# Patient Record
Sex: Female | Born: 1955 | Race: White | Hispanic: No | Marital: Single | Smoking: Never smoker
Health system: Southern US, Community
[De-identification: ages and names within clinical notes are randomized; demographics above are authoritative.]

## PROBLEM LIST (undated history)

## (undated) DIAGNOSIS — I10 Essential (primary) hypertension: Secondary | ICD-10-CM

## (undated) HISTORY — PX: REPAIR OF ACUTE ASCENDING THORACIC AORTIC DISSECTION: SHX6323

---

## 2009-10-17 ENCOUNTER — Emergency Department: Payer: Self-pay | Admitting: Unknown Physician Specialty

## 2013-05-28 ENCOUNTER — Emergency Department (HOSPITAL_COMMUNITY): Payer: BC Managed Care – PPO

## 2013-05-28 ENCOUNTER — Emergency Department (HOSPITAL_COMMUNITY)
Admission: EM | Admit: 2013-05-28 | Discharge: 2013-05-28 | Payer: BC Managed Care – PPO | Attending: Emergency Medicine | Admitting: Emergency Medicine

## 2013-05-28 ENCOUNTER — Encounter (HOSPITAL_COMMUNITY): Payer: Self-pay | Admitting: Emergency Medicine

## 2013-05-28 DIAGNOSIS — Z79899 Other long term (current) drug therapy: Secondary | ICD-10-CM | POA: Insufficient documentation

## 2013-05-28 DIAGNOSIS — I10 Essential (primary) hypertension: Secondary | ICD-10-CM | POA: Insufficient documentation

## 2013-05-28 DIAGNOSIS — R209 Unspecified disturbances of skin sensation: Secondary | ICD-10-CM | POA: Diagnosis present

## 2013-05-28 DIAGNOSIS — I71019 Dissection of thoracic aorta, unspecified: Secondary | ICD-10-CM

## 2013-05-28 DIAGNOSIS — I7101 Dissection of thoracic aorta: Secondary | ICD-10-CM

## 2013-05-28 DIAGNOSIS — Z7982 Long term (current) use of aspirin: Secondary | ICD-10-CM | POA: Insufficient documentation

## 2013-05-28 HISTORY — DX: Essential (primary) hypertension: I10

## 2013-05-28 LAB — CBC WITH DIFFERENTIAL/PLATELET
Eosinophils Absolute: 0.2 10*3/uL (ref 0.0–0.7)
Eosinophils Relative: 2 % (ref 0–5)
HCT: 41.1 % (ref 36.0–46.0)
Hemoglobin: 14.3 g/dL (ref 12.0–15.0)
Lymphs Abs: 2.7 10*3/uL (ref 0.7–4.0)
MCH: 31.9 pg (ref 26.0–34.0)
MCV: 91.7 fL (ref 78.0–100.0)
Monocytes Relative: 6 % (ref 3–12)
Neutrophils Relative %: 63 % (ref 43–77)
RBC: 4.48 MIL/uL (ref 3.87–5.11)
WBC: 9.2 10*3/uL (ref 4.0–10.5)

## 2013-05-28 LAB — POCT I-STAT, CHEM 8
BUN: 18 mg/dL (ref 6–23)
Calcium, Ion: 1.13 mmol/L (ref 1.12–1.23)
Chloride: 106 mEq/L (ref 96–112)
Creatinine, Ser: 0.9 mg/dL (ref 0.50–1.10)
Glucose, Bld: 232 mg/dL — ABNORMAL HIGH (ref 70–99)
HCT: 45 % (ref 36.0–46.0)
Hemoglobin: 15.3 g/dL — ABNORMAL HIGH (ref 12.0–15.0)

## 2013-05-28 LAB — COMPREHENSIVE METABOLIC PANEL
Alkaline Phosphatase: 49 U/L (ref 39–117)
BUN: 18 mg/dL (ref 6–23)
Calcium: 9.8 mg/dL (ref 8.4–10.5)
GFR calc Af Amer: >90 mL/min (ref >90)
Glucose, Bld: 237 mg/dL — ABNORMAL HIGH (ref 70–99)
Potassium: 3.7 mEq/L (ref 3.5–5.1)
Total Bilirubin: 0.8 mg/dL (ref 0.3–1.2)
Total Protein: 7.3 g/dL (ref 6.0–8.3)

## 2013-05-28 LAB — TYPE AND SCREEN: ABO/RH(D): O POS

## 2013-05-28 LAB — GLUCOSE, CAPILLARY: Glucose-Capillary: 197 mg/dL — ABNORMAL HIGH (ref 70–99)

## 2013-05-28 LAB — LACTIC ACID, PLASMA: Lactic Acid, Venous: 2.8 mmol/L — ABNORMAL HIGH (ref 0.5–2.2)

## 2013-05-28 MED ORDER — LABETALOL HCL 5 MG/ML IV SOLN
20.0000 mg | Freq: Once | INTRAVENOUS | Status: AC
  Administered 2013-05-28: 20 mg via INTRAVENOUS
  Filled 2013-05-28: qty 4

## 2013-05-28 MED ORDER — MORPHINE SULFATE 2 MG/ML IJ SOLN
2.0000 mg | Freq: Once | INTRAMUSCULAR | Status: AC
  Administered 2013-05-28: 2 mg via INTRAVENOUS
  Filled 2013-05-28: qty 1

## 2013-05-28 MED ORDER — LABETALOL HCL 5 MG/ML IV SOLN
1.0000 mg/min | INTRAVENOUS | Status: DC
  Filled 2013-05-28: qty 100

## 2013-05-28 MED ORDER — LABETALOL HCL 5 MG/ML IV SOLN
INTRAVENOUS | Status: AC
  Filled 2013-05-28: qty 4

## 2013-05-28 MED ORDER — IOHEXOL 350 MG/ML SOLN
100.0000 mL | Freq: Once | INTRAVENOUS | Status: AC | PRN
  Administered 2013-05-28: 100 mL via INTRAVENOUS

## 2013-05-28 MED ORDER — LABETALOL HCL 5 MG/ML IV SOLN
20.0000 mg | Freq: Once | INTRAVENOUS | Status: AC
  Administered 2013-05-28: 20 mg via INTRAVENOUS

## 2013-05-28 MED ORDER — MORPHINE SULFATE 4 MG/ML IJ SOLN
4.0000 mg | Freq: Once | INTRAMUSCULAR | Status: AC
  Administered 2013-05-28: 4 mg via INTRAVENOUS
  Filled 2013-05-28: qty 1

## 2013-05-28 MED ORDER — SODIUM CHLORIDE 0.9 % IV SOLN
Freq: Once | INTRAVENOUS | Status: AC
  Administered 2013-05-28: 13:00:00 via INTRAVENOUS

## 2013-05-28 NOTE — ED Notes (Signed)
Pt has been accepted to [facility]. [facility] preparing for transfer.

## 2013-05-28 NOTE — ED Notes (Signed)
Pt's family now at bedside. Pt is A&O x4 but weak and clammy. Unable to palpate pedal pulse bilaterally. Weak femoral pulse bilaterally.

## 2013-05-28 NOTE — ED Notes (Signed)
Pt's O2 % dropped into mid-80s. Pt placed on NRB mask.Pulse ox increasing to upper 90s.

## 2013-05-28 NOTE — ED Notes (Signed)
Pt is extremely weak in lower extremities and states she is unable to move her legs. Faint pedal pulse palpated on left foot. Unable to palpate pulse on right foot. Weak femoral pulses palpated bilaterally.

## 2013-05-28 NOTE — ED Notes (Signed)
[facility] have arrived for transport. Waiting to hear back from Cardiology team about which facility pt will be transferred to.

## 2013-05-28 NOTE — ED Notes (Signed)
Report called to charge nurse at [facility] ED. [facility] in route.

## 2013-05-28 NOTE — ED Provider Notes (Addendum)
CSN: [id number]     Arrival date & time 05/28/13  1131 History  This chart was scribed for Julie Lennert, MD by Ardelia Mems, ED Scribe. This patient was seen in room APA01/APA01 and the patient's care was started at 11:49 AM.   Chief Complaint  Patient presents with  . Numbness    Patient is a 57 y.o. female presenting with chest pain. The history is provided by the patient. No language interpreter was used.  Chest Pain Pain location:  Unable to specify Pain severity:  Moderate Onset quality:  Gradual Progression:  Improving Relieved by:  None tried Worsened by:  Nothing tried Ineffective treatments:  None tried Associated symptoms: numbness   Associated symptoms: no abdominal pain, no back pain, no cough, no fatigue and no headache   Risk factors comment:  History of aortic dissection   HPI Comments: Julie Singleton is a 57 y.o. Female with a history of HTN and aortic dissection brought by EMS to the Emergency Department complaining of bilateral leg numbness onset about 1 hour ago. She denies any associated leg pain. She states that she had chest pain prior to the onset of her leg numbness, and she states that her chest pain has resolved some since onset. She states that she has a history of aortic dissection 3 years ago, which was surgically repaired at [facility]. She denies abdominal pain.   Past Medical History  Diagnosis Date  . Hypertension    Past Surgical History  Procedure Laterality Date  . Repair of acute ascending thoracic aortic dissection     No family history on file. History  Substance Use Topics  . Smoking status: Not on file  . Smokeless tobacco: Not on file  . Alcohol Use: Not on file   OB History   Grav Para Term Preterm Abortions TAB SAB Ect Mult Living                 Review of Systems  Constitutional: Negative for appetite change and fatigue.  HENT: Negative for congestion, ear discharge and sinus pressure.   Eyes: Negative for discharge.   Respiratory: Negative for cough.   Cardiovascular: Positive for chest pain.  Gastrointestinal: Negative for abdominal pain and diarrhea.  Genitourinary: Negative for frequency and hematuria.  Musculoskeletal: Negative for back pain.  Skin: Negative for rash.  Neurological: Positive for numbness. Negative for seizures and headaches.  Psychiatric/Behavioral: Negative for hallucinations.    Allergies  Review of patient's allergies indicates no known allergies.  Home Medications   Current Outpatient Rx  Name  Route  Sig  Dispense  Refill  . aspirin EC 81 MG tablet   Oral   Take 81 mg by mouth daily.         Marland Kitchen lisinopril (PRINIVIL,ZESTRIL) 5 MG tablet   Oral   Take 5 mg by mouth daily.         . metoprolol succinate (TOPROL-XL) 25 MG 24 hr tablet   Oral   Take 25 mg by mouth daily.         . sertraline (ZOLOFT) 50 MG tablet   Oral   Take 50 mg by mouth daily.         . simvastatin (ZOCOR) 40 MG tablet   Oral   Take 40 mg by mouth at bedtime.           Triage Vitals: BP 191/89  Pulse 62  Temp(Src) 97.3 F (36.3 C) (Oral)  Resp 28  SpO2 97%  Physical Exam  Nursing note and vitals reviewed. Constitutional: She is oriented to person, place, and time. She appears well-developed.  Mildly diaphoretic.   HENT:  Head: Normocephalic.  Eyes: Conjunctivae and EOM are normal. No scleral icterus.  Neck: Neck supple. No thyromegaly present.  Cardiovascular: Normal rate and regular rhythm.  Exam reveals no gallop and no friction rub.   No murmur heard. 1+ femoral pulses.  Pulmonary/Chest: No stridor. She has no wheezes. She has no rales. She exhibits no tenderness.  Healed scar on chest.   Abdominal: She exhibits no distension. There is no tenderness. There is no rebound.  Musculoskeletal: Normal range of motion. She exhibits no edema.  Mild modeled lower extremities bilaterally. Both feet feel cold.   Lymphadenopathy:    She has no cervical adenopathy.   Neurological: She is oriented to person, place, and time. She exhibits normal muscle tone. Coordination normal.  Skin: No rash noted. No erythema.  Psychiatric: She has a normal mood and affect. Her behavior is normal.    ED Course  Procedures (including critical care time)  DIAGNOSTIC STUDIES: Oxygen Saturation is 97% on RA, normal by my interpretation.    COORDINATION OF CARE: 11:58 AM- Discussed plan for diagnostic lab work and radiology. Will order IV fluids. Pt advised of plan for treatment and pt agrees.  12:43 PM-Recheck with pt. Discussed possible plan for admission to [facility] and pt agrees.  Medications  0.9 %  sodium chloride infusion ( Intravenous New Bag/Given 05/28/13 1230)  iohexol (OMNIPAQUE) 350 MG/ML injection 100 mL (100 mLs Intravenous Contrast Given 05/28/13 1206)  labetalol (NORMODYNE,TRANDATE) injection 20 mg (20 mg Intravenous Given 05/28/13 1248)  labetalol (NORMODYNE,TRANDATE) injection 20 mg (20 mg Intravenous Given 05/28/13 1310)  morphine 4 MG/ML injection 4 mg (4 mg Intravenous Given 05/28/13 1313)  morphine 2 MG/ML injection 2 mg (2 mg Intravenous Given 05/28/13 1351)  labetalol (NORMODYNE,TRANDATE) injection 20 mg (20 mg Intravenous Given 05/28/13 1351)   Labs Review Labs Reviewed  GLUCOSE, CAPILLARY - Abnormal; Notable for the following:    Glucose-Capillary 197 (*)    All other components within normal limits  COMPREHENSIVE METABOLIC PANEL - Abnormal; Notable for the following:    Glucose, Bld 237 (*)    All other components within normal limits  LACTIC ACID, PLASMA - Abnormal; Notable for the following:    Lactic Acid, Venous 2.8 (*)    All other components within normal limits  POCT I-STAT, CHEM 8 - Abnormal; Notable for the following:    Glucose, Bld 232 (*)    Hemoglobin 15.3 (*)    All other components within normal limits  CBC WITH DIFFERENTIAL  TROPONIN I  POCT I-STAT TROPONIN I  TYPE AND SCREEN   Imaging Review Ct Angio Chest Aorta  W/cm &/or Wo/cm  05/28/2013   CLINICAL DATA:  Chest pain, bilateral leg weakness and numbness, evaluate for aortic dissection  EXAM: CT ANGIOGRAPHY CHEST, ABDOMEN AND PELVIS  TECHNIQUE: Multidetector CT imaging through the chest, abdomen and pelvis was performed using the standard protocol during bolus administration of intravenous contrast. Multiplanar reconstructed images including MIPs were obtained and reviewed to evaluate the vascular anatomy. Additional delayed images of the pelvis were obtained.  CONTRAST:  OMNIPAQUE IOHEXOL 350 MG/ML SOLN  COMPARISON:  None.  FINDINGS: CTA CHEST FINDINGS  Mediastinum: High attenuation material in the upper mediastinum consistent with acute mediastinal hemorrhage. The hemorrhage appears to be originating from an acute dissection/contained rupture of the proximal descending thoracic aorta. Hemorrhage  extends cephalad surrounding the proximal left subclavian vein and inferiorly to the aortic hiatus.  Heart/Vascular: Surgical changes of what appears to be tube graft repair of the ascending thoracic aorta with re- implantation of the brachiocephalic artery and left common carotid artery which arise from a common trunk. The left subclavian artery appears to originate from the tube graft just proximal to the anastomosis with the native aorta. 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 The true lumen is nearly completely collapsed. There is likely a contained rupture of the proximal descending thoracic aorta given the amount of surrounding mediastinal hematoma. No definite active extravasation into the mediastinum identified. Mild dilatation of the aortic root which measures 4.1 cm at the sinuses of Valsalva. The aortic valve appears to be native. Mild left atrial and ventricular dilatation.   Lungs/Pleura: Mild dependent atelectasis bilaterally. Small layering left pleural effusion appears water attenuation. No definite hemothorax.  Bones/Soft Tissues: No acute fracture or aggressive appearing lytic or blastic osseous lesion.  Review of the MIP images confirms the above findings.  CTA ABDOMEN AND PELVIS FINDINGS  VASCULAR  Aorta: Continuation of type B aortic dissection throughout the aorta. The true lumen is significantly compressed and becomes completely obliterated in the infrarenal abdominal aorta. On the initial arterial phase images, no flow reaches the aortic bifurcation. The celiac artery, SMA artery, right renal artery and IMA all arise from the true lumen. The left renal artery appears to arise from the false lumen.  Celiac: Arises from the true lumen. Patent. Conventional hepatic arterial anatomy.  SMA: Arises from the true lumen.  Patent.  Renals: Single dominant renal arteries bilaterally. The right renal artery appears to arise from the true lumen while the left appears to arise predominantly from the false lumen. There may be a small fenestration with some flow from the true lumen as well.  IMA: Arises from the nearly obliterated false lumen.  Inflow: On the initial arterial phase images, no flow is noted in the bilateral common iliac arteries. There is some opacification of contrast material on the left beginning at the common iliac bifurcation, and on the right beginning at the common femoral bifurcation. On the delayed phase images. It appears that the dissection extends to the common iliac bifurcation on the left, and into the proximal common femoral artery on the right.  Veins: No focal venous abnormality detected.  NON-VASCULAR  Abdomen: Unremarkable CT appearance of the stomach, duodenum, spleen, adrenal glands, pancreas and liver. Gallbladder is unremarkable. No intra or extrahepatic biliary ductal dilatation.  Relatively symmetric renal parenchymal enhancement bilaterally. The  visualized renal parenchyma is well enhancing on the delayed images. There is a small amount of excreted contrast material in both renal pelves. No enhancing mass, hydronephrosis or nephrolithiasis.  Colonic diverticular disease without CT evidence of active inflammation. No focal bowel wall thickening to suggest ischemia. Normal appendix in the right lower quadrant. No free fluid or suspicious adenopathy.  Pelvis: Unremarkable bladder, uterus and adnexa. Trace free fluid is likely physiologic. No suspicious adenopathy.  Bones/Soft Tissues: No acute fracture or aggressive appearing lytic or blastic osseous lesion.  Review of the MIP images confirms the above findings.  IMPRESSION: 1. 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 No definite  extravasation of contrast to suggest active hemorrhage. 2. The true lumen of the abdominal aorta is is significantly compressed and nearly obliterated in the infrarenal abdominal aorta. All visceral vessels with the exception of the left renal artery arise from the true lumen. The left renal artery arises predominantly from the false lumen, however there may be a small fenestration with some flow from the true lumen as well. 3. The dissection originates at the ascending aortic tube graft to the native descending aorta anastomosis and appears to extend to the common iliac bifurcation on the left, and the proximal common femoral artery at the inguinal ligament on the right. There is delayed flow to the bilateral lower extremities secondary to near total obliteration of the true lumen at the aortic bifurcation. 4. No evidence of significant renal or bowel hypoperfusion at this time. 5. Trace left pleural effusion appears low-attenuation. No definite hemothorax. 6. No pericardial effusion or hemopericardium. 7. Surgical changes of prior tube  graft repair of the ascending aorta, presumably related to a prior Stanford type A dissection. The brachiocephalic artery and left common carotid artery are implanted on the proximal ascending aorta via a common trunk, in the left subclavian artery arises from the tube graft just proximal to the anastomosis. The aortic root and aortic valve appear native.  Critical Value/emergent results were called by telephone at the time of interpretation on 05/28/2013 at 1:01 PM to Dr.Maebelle Sulton , who verbally acknowledged these results.  Critical Value/emergent results were called by telephone at the time of interpretation on 05/28/2013 at 1:16 PM to [facility], who verbally acknowledged these results.  Signed,  Sterling Big, MD  [facility]  [facility]   Electronically Signed   By: Malachy Moan M.D.   On: 05/28/2013 13:38   Ct Angio Abd/pel W/ And/or W/o  05/28/2013   CLINICAL DATA:  Chest pain, bilateral leg weakness and numbness, evaluate for aortic dissection  EXAM: CT ANGIOGRAPHY CHEST, ABDOMEN AND PELVIS  TECHNIQUE: Multidetector CT imaging through the chest, abdomen and pelvis was performed using the standard protocol during bolus administration of intravenous contrast. Multiplanar reconstructed images including MIPs were obtained and reviewed to evaluate the vascular anatomy. Additional delayed images of the pelvis were obtained.  CONTRAST:  OMNIPAQUE IOHEXOL 350 MG/ML SOLN  COMPARISON:  None.  FINDINGS: CTA CHEST FINDINGS  Mediastinum: High attenuation material in the upper mediastinum consistent with acute mediastinal hemorrhage. The hemorrhage appears to be originating from an acute dissection/contained rupture of the proximal descending thoracic aorta. Hemorrhage extends cephalad surrounding the proximal left subclavian vein and inferiorly to the aortic hiatus.  Heart/Vascular: Surgical changes of what appears to be tube graft repair of the  ascending thoracic aorta with re- implantation of the brachiocephalic artery and left common carotid artery which arise from a common trunk. The left subclavian artery appears to originate from the tube graft just proximal to the anastomosis with the native aorta. 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 The true lumen is nearly completely collapsed. There is likely a contained rupture of the proximal descending thoracic aorta given the amount of surrounding mediastinal hematoma. No definite active extravasation into the mediastinum identified. Mild dilatation of the aortic root which measures 4.1 cm at the sinuses of Valsalva. The aortic valve appears to be native. Mild left atrial and ventricular  dilatation.  Lungs/Pleura: Mild dependent atelectasis bilaterally. Small layering left pleural effusion appears water attenuation. No definite hemothorax.  Bones/Soft Tissues: No acute fracture or aggressive appearing lytic or blastic osseous lesion.  Review of the MIP images confirms the above findings.  CTA ABDOMEN AND PELVIS FINDINGS  VASCULAR  Aorta: Continuation of type B aortic dissection throughout the aorta. The true lumen is significantly compressed and becomes completely obliterated in the infrarenal abdominal aorta. On the initial arterial phase images, no flow reaches the aortic bifurcation. The celiac artery, SMA artery, right renal artery and IMA all arise from the true lumen. The left renal artery appears to arise from the false lumen.  Celiac: Arises from the true lumen. Patent. Conventional hepatic arterial anatomy.  SMA: Arises from the true lumen.  Patent.  Renals: Single dominant renal arteries bilaterally. The right renal artery appears to arise from the true lumen while the left appears to arise predominantly from the false  lumen. There may be a small fenestration with some flow from the true lumen as well.  IMA: Arises from the nearly obliterated false lumen.  Inflow: On the initial arterial phase images, no flow is noted in the bilateral common iliac arteries. There is some opacification of contrast material on the left beginning at the common iliac bifurcation, and on the right beginning at the common femoral bifurcation. On the delayed phase images. It appears that the dissection extends to the common iliac bifurcation on the left, and into the proximal common femoral artery on the right.  Veins: No focal venous abnormality detected.  NON-VASCULAR  Abdomen: Unremarkable CT appearance of the stomach, duodenum, spleen, adrenal glands, pancreas and liver. Gallbladder is unremarkable. No intra or extrahepatic biliary ductal dilatation.  Relatively symmetric renal parenchymal enhancement bilaterally. The visualized renal parenchyma is well enhancing on the delayed images. There is a small amount of excreted contrast material in both renal pelves. No enhancing mass, hydronephrosis or nephrolithiasis.  Colonic diverticular disease without CT evidence of active inflammation. No focal bowel wall thickening to suggest ischemia. Normal appendix in the right lower quadrant. No free fluid or suspicious adenopathy.  Pelvis: Unremarkable bladder, uterus and adnexa. Trace free fluid is likely physiologic. No suspicious adenopathy.  Bones/Soft Tissues: No acute fracture or aggressive appearing lytic or blastic osseous lesion.  Review of the MIP images confirms the above findings.  IMPRESSION: 1. 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 No definite extravasation of contrast to suggest active hemorrhage. 2. The true lumen of the abdominal aorta is is significantly compressed and nearly obliterated in the  infrarenal abdominal aorta. All visceral vessels with the exception of the left renal artery arise from the true lumen. The left renal artery arises predominantly from the false lumen, however there may be a small fenestration with some flow from the true lumen as well. 3. The dissection originates at the ascending aortic tube graft to the native descending aorta anastomosis and appears to extend to the common iliac bifurcation on the left, and the proximal common femoral artery at the inguinal ligament on the right. There is delayed flow to the bilateral lower extremities secondary to near total obliteration of the true lumen at the aortic bifurcation. 4. No evidence of significant renal or bowel hypoperfusion at this time. 5. Trace left pleural effusion appears low-attenuation. No definite hemothorax. 6. No pericardial effusion or hemopericardium. 7. Surgical  changes of prior tube graft repair of the ascending aorta, presumably related to a prior Stanford type A dissection. The brachiocephalic artery and left common carotid artery are implanted on the proximal ascending aorta via a common trunk, in the left subclavian artery arises from the tube graft just proximal to the anastomosis. The aortic root and aortic valve appear native.  Critical Value/emergent results were called by telephone at the time of interpretation on 05/28/2013 at 1:01 PM to Dr.Ingrid Shifrin , who verbally acknowledged these results.  Critical Value/emergent results were called by telephone at the time of interpretation on 05/28/2013 at 1:16 PM to [facility], who verbally acknowledged these results.  Signed,  Sterling Big, MD  [facility]  [facility] Radiology   Electronically Signed   By: Malachy Moan M.D.   On: 05/28/2013 13:38    EKG Interpretation     Ventricular Rate:  63 PR Interval:  144 QRS Duration: 82 QT Interval:  456 QTC Calculation: 466 R Axis:   33 Text Interpretation:   Normal sinus rhythm Nonspecific ST and T wave abnormality Abnormal ECG No previous ECGs available           CRITICAL CARE Performed by: Barre Aydelott L Total critical care time: 65 Critical care time was exclusive of separately billable procedures and treating other patients. Critical care was necessary to treat or prevent imminent or life-threatening deterioration. Critical care was time spent personally by me on the following activities: development of treatment plan with patient and/or surrogate as well as nursing, discussions with consultants, evaluation of patient's response to treatment, examination of patient, obtaining history from patient or surrogate, ordering and performing treatments and interventions, ordering and review of laboratory studies, ordering and review of radiographic studies, pulse oximetry and re-evaluation of patient's condition.  MDM  No diagnosis found. Aortic disection.  I spoke with dr. Hart Rochester and he felt the pt should go to [facility] and have surgery.  He is going to talk to the surgeon and [facility] Dr. Pattricia Boss at [facility] acepted the pt to go to [facility] for his care.  He spoke with dr. Hart Rochester The chart was scribed for me under my direct supervision.  I personally performed the history, physical, and medical decision making and all procedures in the evaluation of this patient.Julie Lennert, MD 05/28/13 1401  Julie Lennert, MD 05/28/13 [phone]

## 2013-05-28 NOTE — ED Notes (Signed)
Pt comes from via EMS with c/o bilateral leg numbness which she states began one hour ago. Pt states she began having chest pain shortly before numbness in legs began. Pt currently denies chest pain. Pt also denies pain in legs. Pt is A&O. Pt denies SOB and headache.

## 2019-12-17 LAB — COLOGUARD: COLOGUARD: NEGATIVE

## 2021-01-25 ENCOUNTER — Emergency Department: Payer: Medicare PPO

## 2021-01-25 ENCOUNTER — Observation Stay
Admission: EM | Admit: 2021-01-25 | Discharge: 2021-01-27 | Disposition: A | Discharge status: Home / Self Care | Payer: Medicare PPO | Attending: Internal Medicine | Admitting: Internal Medicine

## 2021-01-25 ENCOUNTER — Encounter: Payer: Self-pay | Admitting: Internal Medicine

## 2021-01-25 ENCOUNTER — Inpatient Hospital Stay
Admit: 2021-01-25 | Discharge: 2021-01-25 | Disposition: A | Discharge status: Home / Self Care | Payer: Medicare PPO | Attending: Internal Medicine | Admitting: Internal Medicine

## 2021-01-25 ENCOUNTER — Other Ambulatory Visit: Payer: Self-pay

## 2021-01-25 DIAGNOSIS — I4891 Unspecified atrial fibrillation: Secondary | ICD-10-CM | POA: Diagnosis not present

## 2021-01-25 DIAGNOSIS — Z20822 Contact with and (suspected) exposure to covid-19: Secondary | ICD-10-CM | POA: Insufficient documentation

## 2021-01-25 DIAGNOSIS — Z79899 Other long term (current) drug therapy: Secondary | ICD-10-CM | POA: Diagnosis not present

## 2021-01-25 DIAGNOSIS — Z7982 Long term (current) use of aspirin: Secondary | ICD-10-CM | POA: Diagnosis not present

## 2021-01-25 DIAGNOSIS — F32A Depression, unspecified: Secondary | ICD-10-CM | POA: Diagnosis present

## 2021-01-25 DIAGNOSIS — R0602 Shortness of breath: Secondary | ICD-10-CM | POA: Diagnosis present

## 2021-01-25 DIAGNOSIS — I10 Essential (primary) hypertension: Secondary | ICD-10-CM | POA: Diagnosis present

## 2021-01-25 LAB — CBC WITH DIFFERENTIAL/PLATELET
Abs Immature Granulocytes: 0.02 10*3/uL (ref 0.00–0.07)
Basophils Absolute: 0.1 10*3/uL (ref 0.0–0.1)
Basophils Relative: 1 %
Eosinophils Absolute: 0.2 10*3/uL (ref 0.0–0.5)
Eosinophils Relative: 2 %
HCT: 41.4 % (ref 36.0–46.0)
Hemoglobin: 13.5 g/dL (ref 12.0–15.0)
Immature Granulocytes: 0 %
Lymphocytes Relative: 33 %
Lymphs Abs: 3.1 10*3/uL (ref 0.7–4.0)
MCH: 31.1 pg (ref 26.0–34.0)
MCHC: 32.6 g/dL (ref 30.0–36.0)
MCV: 95.4 fL (ref 80.0–100.0)
Monocytes Absolute: 0.5 10*3/uL (ref 0.1–1.0)
Monocytes Relative: 6 %
Neutro Abs: 5.4 10*3/uL (ref 1.7–7.7)
Neutrophils Relative %: 58 %
Platelets: 161 10*3/uL (ref 150–400)
RBC: 4.34 MIL/uL (ref 3.87–5.11)
RDW: 13.4 % (ref 11.5–15.5)
WBC: 9.3 10*3/uL (ref 4.0–10.5)
nRBC: 0 % (ref 0.0–0.2)

## 2021-01-25 LAB — COMPREHENSIVE METABOLIC PANEL
ALT: 24 U/L (ref 0–44)
AST: 47 U/L — ABNORMAL HIGH (ref 15–41)
Albumin: 4 g/dL (ref 3.5–5.0)
Alkaline Phosphatase: 52 U/L (ref 38–126)
Anion gap: 9 (ref 5–15)
BUN: 21 mg/dL (ref 8–23)
CO2: 23 mmol/L (ref 22–32)
Calcium: 8.7 mg/dL — ABNORMAL LOW (ref 8.9–10.3)
Chloride: 107 mmol/L (ref 98–111)
Creatinine, Ser: 0.83 mg/dL (ref 0.44–1.00)
GFR, Estimated: >60 mL/min (ref >60)
Glucose, Bld: 141 mg/dL — ABNORMAL HIGH (ref 70–99)
Potassium: 4.8 mmol/L (ref 3.5–5.1)
Sodium: 139 mmol/L (ref 135–145)
Total Bilirubin: 1.3 mg/dL — ABNORMAL HIGH (ref 0.3–1.2)
Total Protein: 7.3 g/dL (ref 6.5–8.1)

## 2021-01-25 LAB — TROPONIN I (HIGH SENSITIVITY)
Troponin I (High Sensitivity): 13 ng/L (ref <18)
Troponin I (High Sensitivity): 13 ng/L (ref <18)

## 2021-01-25 LAB — ECHOCARDIOGRAM COMPLETE
AR max vel: 1.97 cm2
AV Area VTI: 1.94 cm2
AV Area mean vel: 1.91 cm2
AV Mean grad: 3 mmHg
AV Peak grad: 4.7 mmHg
Ao pk vel: 1.08 m/s
Area-P 1/2: 4.72 cm2
Height: 70 in
MV VTI: 2.3 cm2
S' Lateral: 4.1 cm
Weight: 3494.4 oz

## 2021-01-25 LAB — TSH: TSH: 4.043 u[IU]/mL (ref 0.350–4.500)

## 2021-01-25 LAB — HIV ANTIBODY (ROUTINE TESTING W REFLEX): HIV Screen 4th Generation wRfx: NONREACTIVE

## 2021-01-25 LAB — RESP PANEL BY RT-PCR (FLU A&B, COVID) ARPGX2
Influenza A by PCR: NEGATIVE
Influenza B by PCR: NEGATIVE
SARS Coronavirus 2 by RT PCR: NEGATIVE

## 2021-01-25 LAB — T4, FREE: Free T4: 0.93 ng/dL (ref 0.61–1.12)

## 2021-01-25 LAB — MAGNESIUM: Magnesium: 2.3 mg/dL (ref 1.7–2.4)

## 2021-01-25 LAB — D-DIMER, QUANTITATIVE: D-Dimer, Quant: 7.49 ug/mL-FEU — ABNORMAL HIGH (ref 0.00–0.50)

## 2021-01-25 MED ORDER — SERTRALINE HCL 50 MG PO TABS
25.0000 mg | ORAL_TABLET | Freq: Every day | ORAL | Status: DC
  Administered 2021-01-25 – 2021-01-27 (×3): 25 mg via ORAL
  Filled 2021-01-25 (×3): qty 1

## 2021-01-25 MED ORDER — DILTIAZEM HCL ER COATED BEADS 180 MG PO CP24
180.0000 mg | ORAL_CAPSULE | Freq: Once | ORAL | Status: AC
  Administered 2021-01-25: 180 mg via ORAL
  Filled 2021-01-25: qty 1

## 2021-01-25 MED ORDER — DILTIAZEM HCL-DEXTROSE 125-5 MG/125ML-% IV SOLN (PREMIX)
5.0000 mg/h | INTRAVENOUS | Status: DC
  Administered 2021-01-25: 5 mg/h via INTRAVENOUS
  Filled 2021-01-25: qty 125

## 2021-01-25 MED ORDER — IOHEXOL 350 MG/ML SOLN
100.0000 mL | Freq: Once | INTRAVENOUS | Status: AC | PRN
  Administered 2021-01-25: 100 mL via INTRAVENOUS

## 2021-01-25 MED ORDER — SIMVASTATIN 20 MG PO TABS
40.0000 mg | ORAL_TABLET | Freq: Every day | ORAL | Status: DC
  Administered 2021-01-25 – 2021-01-26 (×2): 40 mg via ORAL
  Filled 2021-01-25 (×2): qty 2

## 2021-01-25 MED ORDER — APIXABAN 5 MG PO TABS
5.0000 mg | ORAL_TABLET | Freq: Two times a day (BID) | ORAL | Status: DC
  Administered 2021-01-25 – 2021-01-27 (×5): 5 mg via ORAL
  Filled 2021-01-25 (×5): qty 1

## 2021-01-25 MED ORDER — ONDANSETRON HCL 4 MG/2ML IJ SOLN: 4.0000 mg | Freq: Four times a day (QID) | INTRAMUSCULAR | Status: DC | PRN

## 2021-01-25 MED ORDER — MAGNESIUM SULFATE 2 GM/50ML IV SOLN
2.0000 g | Freq: Once | INTRAVENOUS | Status: AC
  Administered 2021-01-25: 2 g via INTRAVENOUS
  Filled 2021-01-25: qty 50

## 2021-01-25 MED ORDER — PERFLUTREN LIPID MICROSPHERE
1.0000 mL | INTRAVENOUS | Status: AC | PRN
  Administered 2021-01-25: 2 mL via INTRAVENOUS
  Filled 2021-01-25: qty 10

## 2021-01-25 MED ORDER — METOPROLOL TARTRATE 25 MG PO TABS
25.0000 mg | ORAL_TABLET | Freq: Two times a day (BID) | ORAL | Status: DC
  Administered 2021-01-25 – 2021-01-26 (×3): 25 mg via ORAL
  Filled 2021-01-25 (×3): qty 1

## 2021-01-25 MED ORDER — ACETAMINOPHEN 325 MG PO TABS
650.0000 mg | ORAL_TABLET | ORAL | Status: DC | PRN
  Administered 2021-01-25: 650 mg via ORAL
  Filled 2021-01-25: qty 2

## 2021-01-25 MED ORDER — DILTIAZEM HCL 25 MG/5ML IV SOLN
20.0000 mg | Freq: Once | INTRAVENOUS | Status: AC
  Administered 2021-01-25: 20 mg via INTRAVENOUS
  Filled 2021-01-25: qty 5

## 2021-01-25 NOTE — Progress Notes (Signed)
Pt HR began dropping into high 50's and fluctuated from there to 70's. Drip paused and Dr Joylene Igo notified. Cardizem drip stopped per MD. Will continue to monitor.

## 2021-01-25 NOTE — ED Provider Notes (Signed)
[facility] Emergency Department Provider Note  ____________________________________________  Time seen: Approximately 5:03 AM  I have reviewed the triage vital signs and the nursing notes.   HISTORY  Chief Complaint Tachycardia   HPI Julie Singleton is a 65 y.o. female with a history of hypertension and AAA status postrepair in 2014 who presents for evaluation of shortness of breath and palpitations.  Patient reports that her symptoms started 3 days ago initially with shortness of breath which has been constant and mild at rest but more pronounced with ambulation.  She also has noticed some palpitations and fluttering in her chest.  She reports that her apple watch has shown the patient has been tachycardic with a pulse greater than 105 throughout the last 3 days.  She denies any personal or family history of PE or DVT, recent travel immobilization, leg pain or swelling, hemoptysis, or exogenous hormones.  She denies any history of atrial fibrillation.  She denies cough, fever, abdominal pain, nausea, vomiting, diarrhea.  Past Medical History:  Diagnosis Date   Hypertension   AAA   Prior to Admission medications   Medication Sig Start Date End Date Taking? Authorizing Provider  aspirin EC 81 MG tablet Take 81 mg by mouth daily.    [provider]  lisinopril (PRINIVIL,ZESTRIL) 5 MG tablet Take 5 mg by mouth daily.    [provider]  metoprolol succinate (TOPROL-XL) 25 MG 24 hr tablet Take 25 mg by mouth daily.    [provider]  sertraline (ZOLOFT) 50 MG tablet Take 50 mg by mouth daily.    [provider]  simvastatin (ZOCOR) 40 MG tablet Take 40 mg by mouth at bedtime.     [provider]    Allergies Patient has no known allergies.  No family history on file.  Social History    Review of Systems  Constitutional: Negative for fever. Eyes: Negative for visual changes. ENT: Negative for sore  throat. Neck: No neck pain  Cardiovascular: Negative for chest pain. + palpitations Respiratory: + shortness of breath. Gastrointestinal: Negative for abdominal pain, vomiting or diarrhea. Genitourinary: Negative for dysuria. Musculoskeletal: Negative for back pain. Skin: Negative for rash. Neurological: Negative for headaches, weakness or numbness. Psych: No SI or HI  ____________________________________________   PHYSICAL EXAM:  VITAL SIGNS: ED Triage Vitals  Enc Vitals Group     BP 01/25/21 0312 (!) 160/98     Pulse Rate 01/25/21 0312 (!) 139     Resp 01/25/21 0312 20     Temp 01/25/21 0312 97.6 F (36.4 C)     Temp Source 01/25/21 0312 Oral     SpO2 01/25/21 0312 96 %     Weight 01/25/21 0310 210 lb (95.3 kg)     Height 01/25/21 0310 5\' 10"  (1.778 m)     Head Circumference --      Peak Flow --      Pain Score 01/25/21 0310 2     Pain Loc --      Pain Edu? --      Excl. in GC? --     Constitutional: Alert and oriented. Well appearing and in no apparent distress. HEENT:      Head: Normocephalic and atraumatic.         Eyes: Conjunctivae are normal. Sclera is non-icteric.       Mouth/Throat: Mucous membranes are moist.       Neck: Supple with no signs of meningismus. Cardiovascular: Irregularly irregular rhythm  with tachycardic rate Respiratory: Tachypneic with normal sats and clear lungs.  Gastrointestinal: Soft, non tender, and non distended with positive bowel sounds. No rebound or guarding. Genitourinary: No CVA tenderness. Musculoskeletal:  No edema, cyanosis, or erythema of extremities. Neurologic: Normal speech and language. Face is symmetric. Moving all extremities. No gross focal neurologic deficits are appreciated. Skin: Skin is warm, dry and intact. No rash noted. Psychiatric: Mood and affect are normal. Speech and behavior are normal.  ____________________________________________   LABS (all labs ordered are listed, but only abnormal results are  displayed)  Labs Reviewed  COMPREHENSIVE METABOLIC PANEL - Abnormal; Notable for the following components:      Result Value   Glucose, Bld 141 (*)    Calcium 8.7 (*)    AST 47 (*)    Total Bilirubin 1.3 (*)    All other components within normal limits  D-DIMER, QUANTITATIVE - Abnormal; Notable for the following components:   D-Dimer, Quant 7.49 (*)    All other components within normal limits  RESP PANEL BY RT-PCR (FLU A&B, COVID) ARPGX2  TSH  T4, FREE  MAGNESIUM  CBC WITH DIFFERENTIAL/PLATELET  TROPONIN I (HIGH SENSITIVITY)  TROPONIN I (HIGH SENSITIVITY)   ____________________________________________  EKG  Atrial fibrillation with rate of 136, no ST elevations.  New when compared to prior. ____________________________________________  RADIOLOGY  I have personally reviewed the images performed during this visit and I agree with the Radiologist's read.   Interpretation by Radiologist:  DG Chest Portable 1 View  Result Date: 01/25/2021 CLINICAL DATA:  Shortness of breath EXAM: PORTABLE CHEST 1 VIEW COMPARISON:  05/28/2013 FINDINGS: Cardiac shadow is mildly prominent. Aortic stent graft is noted in the descending thoracic aorta consistent with prior dissection and therapy. The lungs are clear bilaterally. No focal infiltrate or effusion is noted. No bony abnormality is seen. IMPRESSION: Thoracic aortic stent graft in satisfactory position. No acute abnormality noted. Electronically Signed   By: Alcide Clever M.D.   On: 01/25/2021 03:54     ____________________________________________   PROCEDURES  Procedure(s) performed:yes .1-3 Lead EKG Interpretation  Date/Time: 01/25/2021 5:06 AM Performed by: Nita Sickle, MD Authorized by: Nita Sickle, MD     Interpretation: abnormal     ECG rate assessment: tachycardic     Rhythm: atrial fibrillation     Ectopy: none     Conduction: normal    Critical Care performed: yes  CRITICAL CARE Performed by: Nita Sickle  ?  Total critical care time: 30 min  Critical care time was exclusive of separately billable procedures and treating other patients.  Critical care was necessary to treat or prevent imminent or life-threatening deterioration.  Critical care was time spent personally by me on the following activities: development of treatment plan with patient and/or surrogate as well as nursing, discussions with consultants, evaluation of patient's response to treatment, examination of patient, obtaining history from patient or surrogate, ordering and performing treatments and interventions, ordering and review of laboratory studies, ordering and review of radiographic studies, pulse oximetry and re-evaluation of patient's condition.  ____________________________________________   INITIAL IMPRESSION / ASSESSMENT AND PLAN / ED COURSE  65 y.o. female with a history of hypertension and AAA status postrepair in 2014 who presents for evaluation of shortness of breath and palpitations.  Patient reports symptoms started 3 days ago shortness of breath and then the palpitations.  She arrives in A. fib with RVR with no prior history of such.  Slightly tachypneic with clear lungs and normal sats.  Patient was given a dose of IV Cardizem and started on p.o. Cardizem.  Also given a dose of IV magnesium.  Will check labs to rule out electrolyte derangements, dehydration, thyroid storm, PE especially with a history of shortness of breath and tachypnea which preceded these episodes.  Patient placed on telemetry for close monitoring.  Old medical records reviewed.  _________________________ 7:04 AM on 01/25/2021 ----------------------------------------- Labs show no electrolyte derangements, dehydration, thyroid storm, or any other abnormalities.  D-dimer was elevated therefore patient was sent for CT angio of the chest.  Reading is pending.  Patient heart rate has been well controlled for several hours but is back  up in the 120s to 140s.  We will start patient on Cardizem drip and get her admitted to the hospitalist service for further management.    _____________________________________________ Please note:  Patient was evaluated in Emergency Department today for the symptoms described in the history of present illness. Patient was evaluated in the context of the global COVID-19 pandemic, which necessitated consideration that the patient might be at risk for infection with the SARS-CoV-2 virus that causes COVID-19. Institutional protocols and algorithms that pertain to the evaluation of patients at risk for COVID-19 are in a state of rapid change based on information released by regulatory bodies including the CDC and federal and state organizations. These policies and algorithms were followed during the patient's care in the ED.  Some ED evaluations and interventions may be delayed as a result of limited staffing during the pandemic.   [place] Controlled Substance Database was reviewed by me. ____________________________________________   FINAL CLINICAL IMPRESSION(S) / ED DIAGNOSES   Final diagnoses:  Atrial fibrillation with RVR (HCC)      NEW MEDICATIONS STARTED DURING THIS VISIT:  ED Discharge Orders     None        Note:  This document was prepared using Dragon voice recognition software and may include unintentional dictation errors.    Don Perking, [place], MD 01/25/21 [phone]

## 2021-01-25 NOTE — Consult Note (Signed)
Cardiology Consultation Note    Patient ID: Julie Singleton, MRN: [id number], DOB/AGE: September 11, 1955 65 y.o. Admit date: 01/25/2021   Date of Consult: 01/25/2021 Primary Physician: [facility] Primary Cardiologist: none  Chief Complaint: irregular heart beat Reason for Consultation: afib Requesting MD: Dr. Joylene Igo  HPI: Julie Singleton is a 65 y.o. female with history of aortic disease status post abdominal and thoracic repair at [facility] in the past, history of hypertension on chlorthalidone, lisinopril, metoprolol and on simvastatin for hyperlipidemia.  She has a history of irregular heartbeats but has never been diagnosed with atrial fibrillation.  She noted recently her heart was racing.  She presented to the emergency room where she was noted to be in atrial fibrillation with rapid ventricular response.  She was continued on her metoprolol tartrate and placed on a Cardizem drip and started on Eliquis.  Her CHA2DS2-VASc score is 4.  Electrolytes were unremarkable.  Echocardiogram is pending.  EKG showed no active ischemia.  Chest x-ray showed thoracic aortic stent graft and normal position with no acute abnormalities.  Chest CT was negative for pulmonary embolus.  Troponin was negative.  Rate is in better control but she remains in atrial fibrillation.  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 Authorizing Provider  aspirin EC 81 MG tablet Take 81 mg by mouth daily.   Yes [provider]  chlorthalidone (HYGROTON) 25 MG tablet Take 25 mg by mouth daily.   Yes [provider]  lisinopril (ZESTRIL) 30 MG tablet Take 30 mg by mouth daily.   Yes [provider]  metoprolol tartrate (LOPRESSOR) 25 MG tablet Take 25 mg by mouth  2 (two) times daily.   Yes [provider]  sertraline (ZOLOFT) 25 MG tablet Take 25 mg by mouth daily.   Yes [provider]  simvastatin (ZOCOR) 40 MG tablet Take 40 mg by mouth at bedtime.    Yes [provider]    Inpatient Medications:   apixaban  5 mg Oral BID   metoprolol tartrate  25 mg Oral BID   sertraline  25 mg Oral Daily   simvastatin  40 mg Oral QHS    diltiazem (CARDIZEM) infusion 5 mg/hr (01/25/21 0711)    Allergies: No Known Allergies  Social History   Socioeconomic History   Marital status: Single    Spouse name: Not on file   Number of children: Not on file   Years of education: Not on file   Highest education level: Not on file  Occupational History   Not on file  Tobacco Use   Smoking status: Never   Smokeless tobacco: Never  Substance and Sexual Activity   Alcohol use: Not on file   Drug use: Not on file   Sexual activity: Not on file  Other Topics Concern   Not on file  Social History Narrative   Not on file   Social Determinants of Health   Financial Resource Strain: Not on file  Food Insecurity: Not on file  Transportation Needs: Not on file  Physical Activity: Not on file  Stress: Not on file  Social Connections: Not on file  Intimate Partner Violence: Not on file  History reviewed. No pertinent family history.   Review of Systems: A 12-system review of systems was performed and is negative except as noted in the HPI.  Labs: No results for input(s): CKTOTAL, CKMB, TROPONINI in the last 72 hours. Lab Results  Component Value Date   WBC 9.3 01/25/2021   HGB 13.5 01/25/2021   HCT 41.4 01/25/2021   MCV 95.4 01/25/2021   PLT 161 01/25/2021    Recent Labs  Lab 01/25/21 0329  NA 139  K 4.8  CL 107  CO2 23  BUN 21  CREATININE 0.83  CALCIUM 8.7*  PROT 7.3  BILITOT 1.3*  ALKPHOS 52  ALT 24  AST 47*  GLUCOSE 141*   No results found for: CHOL, HDL, LDLCALC, TRIG Lab Results  Component Value  Date   DDIMER 7.49 (H) 01/25/2021    Radiology/Studies:  CT Angio Chest PE W and/or Wo Contrast  Result Date: 01/25/2021 CLINICAL DATA:  65 year old female with tachycardia for 3 days, shortness of breath and weakness. Abnormal D-dimer. Suspected PE. History of treated Stanford type B aortic dissection EXAM: CT ANGIOGRAPHY CHEST WITH CONTRAST TECHNIQUE: Multidetector CT imaging of the chest was performed using the standard protocol during bolus administration of intravenous contrast. Multiplanar CT image reconstructions and MIPs were obtained to evaluate the vascular anatomy. CONTRAST:  OMNIPAQUE IOHEXOL 350 MG/ML SOLN COMPARISON:  CTA Chest, Abdomen, and Pelvis 05/28/2013. FINDINGS: Cardiovascular: Excellent contrast bolus timing in the pulmonary arterial tree. Mild respiratory motion, especially at the lung bases. No focal filling defect identified in the pulmonary arteries to suggest acute pulmonary embolism. No contrast in the aorta. Thoracic aortic endograft from the distal arch through the descending segment with superimposed aortic tortuosity. Periaortic hematoma appears resolved since 2014. Tortuous proximal abdominal aorta. Superimposed calcified aortic atherosclerosis. Superimposed cardiomegaly although not significantly changed since 2014. No pericardial effusion. Mediastinum/Nodes: Negative.  No lymphadenopathy. Lungs/Pleura: Somewhat low lung volumes with mild bilateral gas trapping and atelectasis. Atelectatic changes to the major airways which remain patent. Chronic atelectasis along the tortuous descending thoracic aorta. No pleural effusion. Upper Abdomen: Negative visible liver, spleen, pancreas, adrenal glands, kidneys and bowel in the upper abdomen. Musculoskeletal: Chronic sternotomy. No acute osseous abnormality identified. Review of the MIP images confirms the above findings. IMPRESSION: 1. Negative for acute pulmonary embolus. 2. No aortic contrast today. 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 Aortic Atherosclerosis (ICD10-I70.0). 3. Cardiomegaly. 4. Mild atelectasis. Electronically Signed   By: Odessa Fleming M.D.   On: 01/25/2021 07:15   DG Chest Portable 1 View  Result Date: 01/25/2021 CLINICAL DATA:  Shortness of breath EXAM: PORTABLE CHEST 1 VIEW COMPARISON:  05/28/2013 FINDINGS: Cardiac shadow is mildly prominent. Aortic stent graft is noted in the descending thoracic aorta consistent with prior dissection and therapy. The lungs are clear bilaterally. No focal infiltrate or effusion is noted. No bony abnormality is seen. IMPRESSION: Thoracic aortic stent graft in satisfactory position. No acute abnormality noted. Electronically Signed   By: Alcide Clever M.D.   On: 01/25/2021 03:54    Wt Readings from Last 3 Encounters:  01/25/21 99.1 kg  05/28/13 90.7 kg    EKG: Irregular irregular  Physical Exam:  Blood pressure (!) 115/94, pulse (!) 123, temperature 97.8 F (36.6 C), temperature source Oral, resp. rate 18, height 5\' 10"  (1.778 m), weight 99.1 kg, SpO2 98 %. Body mass index is 31.34 kg/m. General: Well developed, well nourished, in no acute distress.  Head: Normocephalic, atraumatic, sclera non-icteric, no xanthomas, nares are without discharge.  Neck: Negative for carotid bruits. JVD not elevated. Lungs: Clear bilaterally to auscultation without wheezes, rales, or rhonchi. Breathing is unlabored. Heart: Irregular regular rhythm Abdomen: Soft, non-tender, non-distended with normoactive bowel sounds. No hepatomegaly. No rebound/guarding. No obvious abdominal masses. Msk:  Strength and tone appear normal for age. Extremities: No clubbing or cyanosis. No edema.  Distal pedal pulses are 2+ and equal bilaterally. Neuro: Alert and oriented X 3. No facial asymmetry. No focal deficit. Moves all extremities spontaneously. Psych:  Responds to questions appropriately with a normal affect.     Assessment and  Plan  65 year old female with history of aortic disease status post thoracic and abdominal aortic repair who now presents with new onset atrial fibrillation.  She has hypertension which is aggressively controlled.  Electrolytes are unremarkable.  No ischemia.  A. fib rate is controlled with Cardizem.  1.  A. fib-etiology unclear.  Electrolytes unremarkable.  Echo pending.  Would continue with Cardizem for rate control and convert to p.o. later today.  We will also continue with Eliquis at 5 twice daily given CHA2DS2-VASc score of 4.  Would continue with metoprolol.  Once rate is controlled, consideration for discharge for outpatient follow-up.  2.  Hypertension-continue with current regimen.  Signed, Dalia Heading MD 01/25/2021, 9:22 AM Pager: [phone]

## 2021-01-25 NOTE — ED Triage Notes (Signed)
Pt in with co tachycardia for 3 days also co shob and weakness. Pt denies any hx of cardia disease.

## 2021-01-25 NOTE — H&P (Signed)
History and Physical    Julie Singleton AVW:[id number] DOB: 20-Jun-1956 DOA: 01/25/2021  PCP: [facility]   Patient coming from: Home  I have personally briefly reviewed patient's old medical records in [facility]  Chief Complaint: Palpitations  HPI: Julie SalvoSheree A Heady is a 65 y.o. female with medical history significant for hypertension and status post AAA repair in 2014 who presents to the ER for evaluation of a 3-day history of palpitations.  Patient states that she had fluttering and palpitations in her chest which has been persistent but more pronounced on the day of admission.  Palpitation is associated with shortness of breath and chest tightness patient has had no chest pain.   She denies feeling dizzy or lightheaded.  She denies having any diaphoresis, nausea, no vomiting, no abdominal pain, no urinary symptoms, fever, no chills, no cough, no shortness of bowel habits, no blurred vision no focal deficits. Labs show sodium 139, potassium 4.8, chloride 107, glucose 141, BUN 21, creatinine 0.83, calcium 8.7, magnesium 2.3, alkaline phosphatase 52, albumin 4 AST 47, ALT 2357, total bilirubin 9.3, troponin 13 point white count 9.3, hemoglobin 13.5, 95.4, RDW 13.5, platelet 161, D-dimer seven-point TSH 4.0 Respiratory viral panel is negative Chest x-ray reviewed by me shows thoracic aortic stent graft in satisfactory position. No acute abnormality noted. CT angiogram of the chest is negative for acute pulmonary embolus.  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  Aortic atherosclerosis.  Cardiomegaly. Twelve-lead EKG reviewed by me shows rapid atrial fibrillation.   ED Course: Patient is a 65 year old who presents to the emergency room for evaluation of palpitations and is found to be in atrial fibrillation with rapid ventricular rate. She received a dose of Cardizem 20 mg IV push and oral Cardizem with transient improvement  in her heart rate but is now tachycardic requiring Cardizem infusion. She will be admitted to the hospital for further evaluation.     Review of Systems: As per HPI otherwise all other systems reviewed and negative.    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 She has never used smokeless tobacco. No history on file for alcohol use and drug use.  No Known Allergies  Family History  Problem Relation Age of Onset   Bladder Cancer Father       Prior to Admission medications   Medication Sig Start Date End Date Taking? Authorizing Provider  aspirin EC 81 MG tablet Take 81 mg by mouth daily.   Yes [provider]  chlorthalidone (HYGROTON) 25 MG tablet Take 25 mg by mouth daily.   Yes [provider]  lisinopril (ZESTRIL) 30 MG tablet Take 30 mg by mouth daily.   Yes [provider]  metoprolol tartrate (LOPRESSOR) 25 MG tablet Take 25 mg by mouth 2 (two) times daily.   Yes [provider]  sertraline (ZOLOFT) 25 MG tablet Take 25 mg by mouth daily.   Yes [provider]  simvastatin (ZOCOR) 40 MG tablet Take 40 mg by mouth at bedtime.    Yes [provider]    Physical Exam: Vitals:   01/25/21 0700 01/25/21 0730 01/25/21 0800 01/25/21 0831  BP: 108/76 (!) 110/93 106/64 (!) 115/94  Pulse:  (!) 117 (!) 115 (!) 123  Resp: (!) 25 (!) 26  18  Temp:    97.8 F (36.6 C)  TempSrc:    Oral  SpO2:  92% 94% 98%  Weight:    99.1 kg  Height:    5\' 10"  (1.778 m)     Vitals:   01/25/21 0700 01/25/21 0730 01/25/21 0800 01/25/21 0831  BP: 108/76 (!) 110/93 106/64 (!) 115/94  Pulse:  (!) 117 (!) 115 (!) 123  Resp: (!) 25 (!) 26  18  Temp:    97.8 F (36.6 C)  TempSrc:    Oral  SpO2:  92% 94% 98%  Weight:    99.1 kg  Height:    5\' 10"  (1.778 m)       Constitutional: Alert and oriented x 3 . Not in any apparent distress HEENT:      Head: Normocephalic and atraumatic.         Eyes: PERLA, EOMI, Conjunctivae are normal. Sclera is non-icteric.       Mouth/Throat: Mucous membranes are moist.       Neck: Supple with no signs of meningismus. Cardiovascular: Irregularly irregular, tachycardic. No murmurs, gallops, or rubs. 2+ symmetrical distal pulses are present . No JVD. No LE edema Respiratory: Respiratory effort normal .Lungs sounds clear bilaterally. No wheezes, crackles, or rhonchi.  Gastrointestinal: Soft, non tender, and non distended with positive bowel sounds.  Genitourinary: No CVA tenderness. Musculoskeletal: Nontender with normal range of motion in all extremities. No cyanosis, or erythema of extremities. Neurologic:  Face is symmetric. Moving all extremities. No gross focal neurologic deficits . Skin: Skin is warm, dry.  No rash or ulcers Psychiatric: Mood and affect are normal    Labs on Admission: I have personally reviewed following labs and imaging studies  CBC: Recent Labs  Lab 01/25/21 0329  WBC 9.3  NEUTROABS 5.4  HGB 13.5  HCT 41.4  MCV 95.4  PLT 161   Basic Metabolic Panel: Recent Labs  Lab 01/25/21 0329  NA 139  K 4.8  CL 107  CO2 23  GLUCOSE 141*  BUN 21  CREATININE 0.83  CALCIUM 8.7*  MG 2.3   GFR: Estimated Creatinine Clearance: 84.9 mL/min (by C-G formula based on SCr of 0.83 mg/dL). Liver Function Tests: Recent Labs  Lab 01/25/21 0329  AST 47*  ALT 24  ALKPHOS 52  BILITOT 1.3*  PROT 7.3  ALBUMIN 4.0   No results for input(s): LIPASE, AMYLASE in the last 168 hours. No results for input(s): AMMONIA in the last 168 hours. Coagulation Profile: No results for input(s): INR, PROTIME in the last 168 hours. Cardiac Enzymes: No results for input(s): CKTOTAL, CKMB, CKMBINDEX, TROPONINI in the last 168 hours. BNP (last 3 results) No results for input(s): PROBNP in the last 8760  hours. HbA1C: No results for input(s): HGBA1C in the last 72 hours. CBG: No results for input(s): GLUCAP in the last 168 hours. Lipid Profile: No results for input(s): CHOL, HDL, LDLCALC, TRIG, CHOLHDL, LDLDIRECT in the last 72 hours. Thyroid Function Tests: Recent Labs    01/25/21 0329  TSH 4.043  FREET4 0.93   Anemia Panel: No results for input(s): VITAMINB12, FOLATE, FERRITIN, TIBC, IRON, RETICCTPCT in the last 72 hours. Urine analysis: No results found for: COLORURINE, APPEARANCEUR, LABSPEC, PHURINE, GLUCOSEU, HGBUR, BILIRUBINUR, KETONESUR, PROTEINUR, UROBILINOGEN, NITRITE, LEUKOCYTESUR  Radiological Exams on Admission: CT Angio Chest PE W and/or Wo Contrast  Result Date: 01/25/2021 CLINICAL DATA:  65 year old female with tachycardia for 3 days, shortness of breath and weakness.  Abnormal D-dimer. Suspected PE. History of treated Stanford type B aortic dissection EXAM: CT ANGIOGRAPHY CHEST WITH CONTRAST TECHNIQUE: Multidetector CT imaging of the chest was performed using the standard protocol during bolus administration of intravenous contrast. Multiplanar CT image reconstructions and MIPs were obtained to evaluate the vascular anatomy. CONTRAST:  OMNIPAQUE IOHEXOL 350 MG/ML SOLN COMPARISON:  CTA Chest, Abdomen, and Pelvis 05/28/2013. FINDINGS: Cardiovascular: Excellent contrast bolus timing in the pulmonary arterial tree. Mild respiratory motion, especially at the lung bases. No focal filling defect identified in the pulmonary arteries to suggest acute pulmonary embolism. No contrast in the aorta. Thoracic aortic endograft from the distal arch through the descending segment with superimposed aortic tortuosity. Periaortic hematoma appears resolved since 2014. Tortuous proximal abdominal aorta. Superimposed calcified aortic atherosclerosis. Superimposed cardiomegaly although not significantly changed since 2014. No pericardial effusion. Mediastinum/Nodes: Negative.  No lymphadenopathy.  Lungs/Pleura: Somewhat low lung volumes with mild bilateral gas trapping and atelectasis. Atelectatic changes to the major airways which remain patent. Chronic atelectasis along the tortuous descending thoracic aorta. No pleural effusion. Upper Abdomen: Negative visible liver, spleen, pancreas, adrenal glands, kidneys and bowel in the upper abdomen. Musculoskeletal: Chronic sternotomy. No acute osseous abnormality identified. Review of the MIP images confirms the above findings. IMPRESSION: 1. Negative for acute pulmonary embolus. 2. No aortic contrast today. 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 Aortic Atherosclerosis (ICD10-I70.0). 3. Cardiomegaly. 4. Mild atelectasis. Electronically Signed   By: Odessa Fleming M.D.   On: 01/25/2021 07:15   DG Chest Portable 1 View  Result Date: 01/25/2021 CLINICAL DATA:  Shortness of breath EXAM: PORTABLE CHEST 1 VIEW COMPARISON:  05/28/2013 FINDINGS: Cardiac shadow is mildly prominent. Aortic stent graft is noted in the descending thoracic aorta consistent with prior dissection and therapy. The lungs are clear bilaterally. No focal infiltrate or effusion is noted. No bony abnormality is seen. IMPRESSION: Thoracic aortic stent graft in satisfactory position. No acute abnormality noted. Electronically Signed   By: Alcide Clever M.D.   On: 01/25/2021 03:54     Assessment/Plan Principal Problem:   Atrial fibrillation with RVR (HCC) Active Problems:   Hypertension   Depression      Atrial fibrillation with rapid ventricular rate New onset Continue IV Cardizem for rate control Continue oral metoprolol and uptitrate as tolerated to optimize rate control Patient has a CHA2DS2-VASc score of 3 and ideally requires long-term anticoagulation as primary prophylaxis for an acute stroke Will start patient on Eliquis 5 mg twice daily Obtain 2D echocardiogram to assess LVEF and rule out valvular  pathology      Hypertension Continue metoprolol    Depression Continue sertraline  DVT prophylaxis: Eliquis Code Status: full code  Family Communication: Greater than 50% of time was spent discussing patient's condition and plan of care with her at the bedside.  All questions and concerns have been addressed.  She verbalizes understanding and agrees with the plan. Disposition Plan: Back to previous home environment Consults called: Cardiology Status: At the time of admission, it appears that the appropriate admission status for this patient is inpatient. This is judged to be reasonable and necessary in order to provide the required intensity of service to ensure the patient's safety given the presenting symptoms, physical exam findings, and initial radiographic and laboratory data in the context of their comorbid conditions. Patient requires inpatient status due to high intensity of service, high risk for further deterioration and high frequency of surveillance required.  Lucile Shutters MD [facility]     01/25/2021, 9:32 AM

## 2021-01-25 NOTE — Progress Notes (Signed)
*  PRELIMINARY RESULTS* Echocardiogram 2D Echocardiogram has been performed.  Julie Singleton 01/25/2021, 11:47 AM

## 2021-01-26 DIAGNOSIS — I10 Essential (primary) hypertension: Secondary | ICD-10-CM | POA: Diagnosis not present

## 2021-01-26 DIAGNOSIS — I4891 Unspecified atrial fibrillation: Secondary | ICD-10-CM | POA: Diagnosis present

## 2021-01-26 LAB — BASIC METABOLIC PANEL
Anion gap: 8 (ref 5–15)
BUN: 14 mg/dL (ref 8–23)
CO2: 27 mmol/L (ref 22–32)
Calcium: 8.8 mg/dL — ABNORMAL LOW (ref 8.9–10.3)
Chloride: 106 mmol/L (ref 98–111)
Creatinine, Ser: 0.55 mg/dL (ref 0.44–1.00)
GFR, Estimated: >60 mL/min (ref >60)
Glucose, Bld: 117 mg/dL — ABNORMAL HIGH (ref 70–99)
Potassium: 3.7 mmol/L (ref 3.5–5.1)
Sodium: 141 mmol/L (ref 135–145)

## 2021-01-26 LAB — CBC
HCT: 37.6 % (ref 36.0–46.0)
Hemoglobin: 12.9 g/dL (ref 12.0–15.0)
MCH: 32.4 pg (ref 26.0–34.0)
MCHC: 34.3 g/dL (ref 30.0–36.0)
MCV: 94.5 fL (ref 80.0–100.0)
Platelets: 144 10*3/uL — ABNORMAL LOW (ref 150–400)
RBC: 3.98 MIL/uL (ref 3.87–5.11)
RDW: 13.6 % (ref 11.5–15.5)
WBC: 6.5 10*3/uL (ref 4.0–10.5)
nRBC: 0 % (ref 0.0–0.2)

## 2021-01-26 MED ORDER — DIGOXIN 250 MCG PO TABS: 250.0000 ug | ORAL_TABLET | Freq: Every day | ORAL | 0 refills | Status: DC

## 2021-01-26 MED ORDER — DIGOXIN 0.25 MG/ML IJ SOLN
0.2500 mg | Freq: Once | INTRAMUSCULAR | Status: AC
  Administered 2021-01-26: 0.25 mg via INTRAVENOUS
  Filled 2021-01-26: qty 2

## 2021-01-26 MED ORDER — APIXABAN 5 MG PO TABS: 5.0000 mg | ORAL_TABLET | Freq: Two times a day (BID) | ORAL | 0 refills | Status: AC

## 2021-01-26 MED ORDER — METOPROLOL TARTRATE 5 MG/5ML IV SOLN: 5.0000 mg | Freq: Four times a day (QID) | INTRAVENOUS | Status: DC | PRN

## 2021-01-26 MED ORDER — DILTIAZEM HCL ER COATED BEADS 240 MG PO CP24: 240.0000 mg | ORAL_CAPSULE | Freq: Every day | ORAL | 0 refills | Status: AC

## 2021-01-26 MED ORDER — DILTIAZEM HCL ER COATED BEADS 120 MG PO CP24
240.0000 mg | ORAL_CAPSULE | Freq: Every day | ORAL | Status: DC
  Administered 2021-01-26 – 2021-01-27 (×2): 240 mg via ORAL
  Filled 2021-01-26 (×2): qty 2

## 2021-01-26 MED ORDER — METOPROLOL TARTRATE 25 MG PO TABS
25.0000 mg | ORAL_TABLET | Freq: Once | ORAL | Status: AC
  Administered 2021-01-26: 25 mg via ORAL
  Filled 2021-01-26: qty 1

## 2021-01-26 MED ORDER — METOPROLOL TARTRATE 25 MG PO TABS
25.0000 mg | ORAL_TABLET | Freq: Three times a day (TID) | ORAL | Status: DC
  Administered 2021-01-26: 25 mg via ORAL
  Filled 2021-01-26: qty 1

## 2021-01-26 MED ORDER — METOPROLOL TARTRATE 50 MG PO TABS
50.0000 mg | ORAL_TABLET | Freq: Two times a day (BID) | ORAL | Status: DC
  Administered 2021-01-26 – 2021-01-27 (×2): 50 mg via ORAL
  Filled 2021-01-26 (×2): qty 1

## 2021-01-26 MED ORDER — METOPROLOL TARTRATE 25 MG PO TABS: 25.0000 mg | ORAL_TABLET | Freq: Two times a day (BID) | ORAL | Status: DC

## 2021-01-26 MED ORDER — METOPROLOL TARTRATE 5 MG/5ML IV SOLN: 5.0000 mg | Freq: Four times a day (QID) | INTRAVENOUS | Status: DC

## 2021-01-26 MED ORDER — METOPROLOL TARTRATE 50 MG PO TABS: 50.0000 mg | ORAL_TABLET | Freq: Two times a day (BID) | ORAL | 0 refills | Status: AC

## 2021-01-26 NOTE — Progress Notes (Signed)
Patient Name: Julie SalvoSheree A Exley Date of Encounter: 01/26/2021  Hospital Problem List     Principal Problem:   Atrial fibrillation with RVR [facility](HCC) Active Problems:   Hypertension   Depression   Atrial fibrillation with rapid ventricular response [facility](HCC)    Patient Profile     65 y.o. female with history of aortic disease status post abdominal and thoracic repair at [facility] in the past, history of hypertension on chlorthalidone, lisinopril, metoprolol and on simvastatin for hyperlipidemia.  She has a history of irregular heartbeats but has never been diagnosed with atrial fibrillation.  She noted recently her heart was racing.  She presented to the emergency room where she was noted to be in atrial fibrillation with rapid ventricular response.  She was continued on her metoprolol tartrate and placed on a Cardizem drip and started on Eliquis.  Her CHA2DS2-VASc score is 4.  Electrolytes were unremarkable.  Echocardiogram is pending.  EKG showed no active ischemia.  Chest x-ray showed thoracic aortic stent graft and normal position with no acute abnormalities.  Chest CT was negative for pulmonary embolus.  Troponin was negative.    Subjective   Rate still somewhat rapid in A. fib however patient is asymptomatic.  Inpatient Medications     apixaban  5 mg Oral BID   metoprolol tartrate  25 mg Oral BID   sertraline  25 mg Oral Daily   simvastatin  40 mg Oral QHS    Vital Signs    Vitals:   01/26/21 0329 01/26/21 0335 01/26/21 0818 01/26/21 1222  BP: (!) 130/92  (!) 132/99 (!) 111/98  Pulse: (!) 111 97 (!) 130 (!) 119  Resp:   18 16  Temp: (!) 97.5 F (36.4 C)  98 F (36.7 C) 97.6 F (36.4 C)  TempSrc:   Oral Oral  SpO2: 94%  95% 97%  Weight:      Height:        Intake/Output Summary (Last 24 hours) at 01/26/2021 1349 Last data filed at 01/26/2021 1018 Gross per 24 hour  Intake 360 ml  Output --  Net 360 ml   Filed Weights   01/25/21 0310 01/25/21 0831  Weight: 95.3 kg 99.1 kg     Physical Exam    GEN: Well nourished, well developed, in no acute distress.  HEENT: normal.  Neck: Supple, no JVD, carotid bruits, or masses. Cardiac: Irregular irregular Respiratory:  Respirations regular and unlabored, clear to auscultation bilaterally. GI: Soft, nontender, nondistended, BS + x 4. MS: no deformity or atrophy. Skin: warm and dry, no rash. Neuro:  Strength and sensation are intact. Psych: Normal affect.  Labs    CBC Recent Labs    01/25/21 0329 01/26/21 0634  WBC 9.3 6.5  NEUTROABS 5.4  --   HGB 13.5 12.9  HCT 41.4 37.6  MCV 95.4 94.5  PLT 161 144*   Basic Metabolic Panel Recent Labs    16/04/9606/12/22 0329 01/26/21 0634  NA 139 141  K 4.8 3.7  CL 107 106  CO2 23 27  GLUCOSE 141* 117*  BUN 21 14  CREATININE 0.83 0.55  CALCIUM 8.7* 8.8*  MG 2.3  --    Liver Function Tests Recent Labs    01/25/21 0329  AST 47*  ALT 24  ALKPHOS 52  BILITOT 1.3*  PROT 7.3  ALBUMIN 4.0   No results for input(s): LIPASE, AMYLASE in the last 72 hours. Cardiac Enzymes No results for input(s): CKTOTAL, CKMB, CKMBINDEX, TROPONINI in the last  72 hours. BNP No results for input(s): BNP in the last 72 hours. D-Dimer Recent Labs    01/25/21 0329  DDIMER 7.49*   Hemoglobin A1C No results for input(s): HGBA1C in the last 72 hours. Fasting Lipid Panel No results for input(s): CHOL, HDL, LDLCALC, TRIG, CHOLHDL, LDLDIRECT in the last 72 hours. Thyroid Function Tests Recent Labs    01/25/21 0329  TSH 4.043    Telemetry    A. fib with RVR  ECG    Atrial fibrillation  Radiology    CT Angio Chest PE W and/or Wo Contrast  Result Date: 01/25/2021 CLINICAL DATA:  65 year old female with tachycardia for 3 days, shortness of breath and weakness. Abnormal D-dimer. Suspected PE. History of treated Stanford type B aortic dissection EXAM: CT ANGIOGRAPHY CHEST WITH CONTRAST TECHNIQUE: Multidetector CT imaging of the chest was performed using the standard protocol  during bolus administration of intravenous contrast. Multiplanar CT image reconstructions and MIPs were obtained to evaluate the vascular anatomy. CONTRAST:  OMNIPAQUE IOHEXOL 350 MG/ML SOLN COMPARISON:  CTA Chest, Abdomen, and Pelvis 05/28/2013. FINDINGS: Cardiovascular: Excellent contrast bolus timing in the pulmonary arterial tree. Mild respiratory motion, especially at the lung bases. No focal filling defect identified in the pulmonary arteries to suggest acute pulmonary embolism. No contrast in the aorta. Thoracic aortic endograft from the distal arch through the descending segment with superimposed aortic tortuosity. Periaortic hematoma appears resolved since 2014. Tortuous proximal abdominal aorta. Superimposed calcified aortic atherosclerosis. Superimposed cardiomegaly although not significantly changed since 2014. No pericardial effusion. Mediastinum/Nodes: Negative.  No lymphadenopathy. Lungs/Pleura: Somewhat low lung volumes with mild bilateral gas trapping and atelectasis. Atelectatic changes to the major airways which remain patent. Chronic atelectasis along the tortuous descending thoracic aorta. No pleural effusion. Upper Abdomen: Negative visible liver, spleen, pancreas, adrenal glands, kidneys and bowel in the upper abdomen. Musculoskeletal: Chronic sternotomy. No acute osseous abnormality identified. Review of the MIP images confirms the above findings. IMPRESSION: 1. Negative for acute pulmonary embolus. 2. No aortic contrast today. 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 Aortic Atherosclerosis (ICD10-I70.0). 3. Cardiomegaly. 4. Mild atelectasis. Electronically Signed   By: Odessa Fleming M.D.   On: 01/25/2021 07:15   DG Chest Portable 1 View  Result Date: 01/25/2021 CLINICAL DATA:  Shortness of breath EXAM: PORTABLE CHEST 1 VIEW COMPARISON:  05/28/2013 FINDINGS: Cardiac shadow is mildly prominent. Aortic stent graft is noted in the  descending thoracic aorta consistent with prior dissection and therapy. The lungs are clear bilaterally. No focal infiltrate or effusion is noted. No bony abnormality is seen. IMPRESSION: Thoracic aortic stent graft in satisfactory position. No acute abnormality noted. Electronically Signed   By: Alcide Clever M.D.   On: 01/25/2021 03:54   ECHOCARDIOGRAM COMPLETE  Result Date: 01/25/2021    ECHOCARDIOGRAM REPORT   Patient Name:   KECIA SWOBODA [facility] Date of Exam: 01/25/2021 Medical Rec #:  [id number]       Height:       70.0 in Accession #:    [id number]      Weight:       218.4 lb Date of Birth:  23-Sep-1955       BSA:          2.167 m Patient Age:    65 years        BP:           115/94 mmHg Patient Gender: F  HR:           97 bpm. Exam Location:  [facility] Procedure: 2D Echo, Color Doppler, Cardiac Doppler and Intracardiac            Opacification Agent Indications:     I48.91 Atrial fibrillation  History:         Patient has no prior history of Echocardiogram examinations.                  Risk Factors:Hypertension.  Sonographer:     Humphrey Rolls RDCS (AE) Referring Phys:  [id number] Lucile Shutters Diagnosing Phys: Harold Hedge MD  Sonographer Comments: Suboptimal apical window and suboptimal subcostal window. IMPRESSIONS  1. Left ventricular ejection fraction, by estimation, is 50 to 55%. The left ventricle has low normal function. The left ventricle demonstrates global hypokinesis. There is moderate left ventricular hypertrophy. Left ventricular diastolic parameters are  consistent with Grade I diastolic dysfunction (impaired relaxation).  2. Right ventricular systolic function is normal. The right ventricular size is mildly enlarged.  3. Left atrial size was mildly dilated.  4. Right atrial size was mildly dilated.  5. The mitral valve is grossly normal. Mild to moderate mitral valve regurgitation.  6. The aortic valve is grossly normal. Aortic valve regurgitation is mild.  7. Aortic root/ascending aorta has  been repaired/replaced and dilatation noted. There is mild dilatation of the aortic root and of the ascending aorta, measuring 37 mm. FINDINGS  Left Ventricle: Left ventricular ejection fraction, by estimation, is 50 to 55%. The left ventricle has low normal function. The left ventricle demonstrates global hypokinesis. Definity contrast agent was given IV to delineate the left ventricular endocardial borders. The left ventricular internal cavity size was normal in size. There is moderate left ventricular hypertrophy. Left ventricular diastolic parameters are consistent with Grade I diastolic dysfunction (impaired relaxation). Right Ventricle: The right ventricular size is mildly enlarged. No increase in right ventricular wall thickness. Right ventricular systolic function is normal. Left Atrium: Left atrial size was mildly dilated. Right Atrium: Right atrial size was mildly dilated. Pericardium: There is no evidence of pericardial effusion. Mitral Valve: The mitral valve is grossly normal. Mild to moderate mitral valve regurgitation. MV peak gradient, 6.4 mmHg. The mean mitral valve gradient is 2.0 mmHg. Tricuspid Valve: The tricuspid valve is grossly normal. Tricuspid valve regurgitation is mild. Aortic Valve: The aortic valve is grossly normal. Aortic valve regurgitation is mild. Aortic valve mean gradient measures 3.0 mmHg. Aortic valve peak gradient measures 4.7 mmHg. Aortic valve area, by VTI measures 1.94 cm. Pulmonic Valve: The pulmonic valve was not well visualized. Pulmonic valve regurgitation is trivial. Aorta: The aortic root/ascending aorta has been repaired/replaced and aortic dilatation noted. There is mild dilatation of the aortic root and of the ascending aorta, measuring 37 mm. IAS/Shunts: The atrial septum is grossly normal.  LEFT VENTRICLE PLAX 2D LVIDd:         5.20 cm  Diastology LVIDs:         4.10 cm  LV e' medial:    6.85 cm/s LV PW:         1.10 cm  LV E/e' medial:  10.3 LV IVS:        1.00  cm  LV e' lateral:   12.30 cm/s LVOT diam:     1.90 cm  LV E/e' lateral: 5.8 LV SV:         40 LV SV Index:   18 LVOT Area:     2.84 cm  LEFT ATRIUM              Index LA diam:        5.10 cm  2.35 cm/m LA Vol (A2C):   98.8 ml  45.60 ml/m LA Vol (A4C):   104.0 ml 48.00 ml/m LA Biplane Vol: 103.0 ml 47.53 ml/m  AORTIC VALVE                   PULMONIC VALVE AV Area (Vmax):    1.97 cm    PV Vmax:       0.72 m/s AV Area (Vmean):   1.91 cm    PV Vmean:      49.600 cm/s AV Area (VTI):     1.94 cm    PV VTI:        0.140 m AV Vmax:           108.00 cm/s PV Peak grad:  2.0 mmHg AV Vmean:          75.500 cm/s PV Mean grad:  1.0 mmHg AV VTI:            0.205 m AV Peak Grad:      4.7 mmHg AV Mean Grad:      3.0 mmHg LVOT Vmax:         75.20 cm/s LVOT Vmean:        50.800 cm/s LVOT VTI:          0.140 m LVOT/AV VTI ratio: 0.68  AORTA Ao Root diam: 3.70 cm MITRAL VALVE               TRICUSPID VALVE MV Area (PHT): 4.72 cm    TR Peak grad:   24.6 mmHg MV Area VTI:   2.30 cm    TR Vmax:        248.00 cm/s MV Peak grad:  6.4 mmHg MV Mean grad:  2.0 mmHg    SHUNTS MV Vmax:       1.26 m/s    Systemic VTI:  0.14 m MV Vmean:      66.2 cm/s   Systemic Diam: 1.90 cm MV Decel Time: 161 msec MV E velocity: 70.83 cm/s Harold Hedge MD Electronically signed by Harold Hedge MD Signature Date/Time: 01/25/2021/12:55:16 PM    Final     Assessment & Plan    65 year old female with history of aortic disease status post thoracic and abdominal aortic repair who now presents with new onset atrial fibrillation.  She has hypertension which is aggressively controlled.  Electrolytes are unremarkable.  No ischemia.  A. fib rate is controlled with Cardizem.  1.  A. fib-still not well controlled.  Continue with Cardizem at 240 mg and a metoprolol tartrate 50 twice daily.  Would ambulate and if patient remains asymptomatic, would discharge to home and I will see in 2 days as an outpatient..  We will also continue with Eliquis at 5 twice daily  given CHA2DS2-VASc score of 4.    2.  Hypertension-continue with current regimen.  Signed, Darlin Priestly Buren Havey MD 01/26/2021, 1:49 PM  Pager: [phone]

## 2021-01-26 NOTE — Care Management Obs Status (Signed)
MEDICARE OBSERVATION STATUS NOTIFICATION   Patient Details  Name: FRADY TADDEO MRN: [id number] Date of Birth: 04/25/1956   Medicare Observation Status Notification Given:  Yes    Gildardo Griffes, LCSW 01/26/2021, 10:21 AM

## 2021-01-26 NOTE — Care Management CC44 (Signed)
Condition Code 44 Documentation Completed  Patient Details  Name: Julie Singleton MRN: [id number] Date of Birth: Dec 11, 1955   Condition Code 44 given:  Yes Patient signature on Condition Code 44 notice:  Yes Documentation of 2 MD's agreement:  Yes Code 44 added to claim:  Yes    Gildardo Griffes, LCSW 01/26/2021, 10:21 AM

## 2021-01-26 NOTE — Discharge Summary (Addendum)
Physician Discharge Summary  Patient ID: Julie Singleton MRN: [id number] DOB/AGE: [birth year] 65 y.o.  Admit date: 01/25/2021 Discharge date: 01/26/2021  Admission Diagnoses:  Discharge Diagnoses:  Principal Problem:   Atrial fibrillation with RVR (HCC) Active Problems:   Hypertension   Depression   Atrial fibrillation with rapid ventricular response (HCC) New onset atrial fibrillation with rapid ventricle response.  Discharged Condition: fair  Hospital Course:  Julie Singleton is a 65 y.o. female with medical history significant for hypertension and status post AAA repair in 2014 who presents to the ER for evaluation of a 3-day history of palpitations.  Patient states that she had fluttering and palpitations in her chest which has been persistent but more pronounced on the day of admission.  Palpitation is associated with shortness of breath and chest tightness patient has had no chest pain.   Patient was initially treated with diltiazem drip, also started Eliquis.  Patient is seen by cardiology, transition to oral diltiazem.  She also received 0.25 mg IV digoxin. Per cardiology recommendation, I walked the patient, heart rate went up to 140, patient is asymptomatic.  Currently, patient is still asymptomatic.  Currently, patient heart rate is 110-120.  Patient is adamant that she will not stay for another day in the hospital.  Dr. Lady Gary had cleared patient for discharge.  At this point, I will give patient continued on diltiazem 240 mg daily, metoprolol increased to 50 mg twice a day.  I also give patient 0.25 mg digoxin daily x3 days.  Patient be followed by cardiology and PCP as soon as possible   Consults: cardiology  Significant Diagnostic Studies:  Echo: 1. Left ventricular ejection fraction, by estimation, is 50 to 55%. The left ventricle has low normal function. The left ventricle demonstrates global hypokinesis. There is moderate left ventricular hypertrophy. Left ventricular  diastolic parameters are consistent with Grade I diastolic dysfunction (impaired relaxation). 2. Right ventricular systolic function is normal. The right ventricular size is mildly enlarged. 3. Left atrial size was mildly dilated. 4. Right atrial size was mildly dilated. 5. The mitral valve is grossly normal. Mild to moderate mitral valve regurgitation. 6. The aortic valve is grossly normal. Aortic valve regurgitation is mild. 7. 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 There is mild dilatation of the aortic root and of the ascending aorta, measuring 37 mm.   Treatments: Diltiazem, eliquis  Discharge Exam: Blood pressure (!) 140/99, pulse (!) 133, temperature 97.6 F (36.4 C), temperature source Oral, resp. rate 16, height 5\' 10"  (1.778 m), weight 99.1 kg, SpO2 97 %. General appearance: alert and cooperative Resp: clear to auscultation bilaterally Cardio: Irregularly irregular, tachycardiac GI: soft, non-tender; bowel sounds normal; no masses,  no organomegaly Extremities: extremities normal, atraumatic, no cyanosis or edema  Disposition: Discharge disposition: 01-Home or Self Care       Discharge Instructions     Amb referral to [facility]   Complete by: As directed    Diet - low sodium heart healthy   Complete by: As directed    Increase activity slowly   Complete by: As directed       Allergies as of 01/26/2021   No Known Allergies      Medication List     STOP taking these medications    chlorthalidone 25 MG tablet Commonly known as: HYGROTON   lisinopril 30 MG tablet Commonly known as: ZESTRIL       TAKE these medications    apixaban 5 MG  Tabs tablet Commonly known as: ELIQUIS Take 1 tablet (5 mg total) by mouth 2 (two) times daily.   aspirin EC 81 MG tablet Take 81 mg by mouth daily.   digoxin 0.25 MG tablet Commonly known as: Lanoxin Take 1 tablet (250 mcg total) by mouth daily for 3 days.   diltiazem  240 MG 24 hr capsule Commonly known as: CARDIZEM CD Take 1 capsule (240 mg total) by mouth daily. Start taking on: January 27, 2021   metoprolol tartrate 50 MG tablet Commonly known as: LOPRESSOR Take 1 tablet (50 mg total) by mouth 2 (two) times daily. What changed:  medication strength how much to take   sertraline 25 MG tablet Commonly known as: ZOLOFT Take 25 mg by mouth daily.   simvastatin 40 MG tablet Commonly known as: ZOCOR Take 40 mg by mouth at bedtime.        Follow-up Information     Dalia Heading, MD Follow up in 1 week(s).   Specialty: Cardiology Contact information: [address] [place] [address] [phone]         [facility] Follow up in 1 week(s).   Contact information: [address] [place] [address] [phone]                 Signed: Marrion Coy 01/26/2021, 5:13 PM

## 2021-01-26 NOTE — Progress Notes (Signed)
Patient with heart rate 120s-130s. Dr. Lady Gary and Dr. Chipper Herb made aware. Patient asymptomatic, will give po meds and cardio to see patient.

## 2021-01-27 DIAGNOSIS — I10 Essential (primary) hypertension: Secondary | ICD-10-CM

## 2021-01-27 DIAGNOSIS — I4891 Unspecified atrial fibrillation: Secondary | ICD-10-CM | POA: Diagnosis not present

## 2021-01-27 NOTE — Plan of Care (Signed)
  Problem: Education: Goal: Knowledge of General Education information will improve Description: Including pain rating scale, medication(s)/side effects and non-pharmacologic comfort measures 01/27/2021 0812 by Ansel Bong, RN Outcome: Adequate for Discharge 01/27/2021 0812 by Ansel Bong, RN Outcome: Adequate for Discharge   Problem: Health Behavior/Discharge Planning: Goal: Ability to manage health-related needs will improve 01/27/2021 0812 by Ansel Bong, RN Outcome: Adequate for Discharge 01/27/2021 0812 by Ansel Bong, RN Outcome: Adequate for Discharge   Problem: Clinical Measurements: Goal: Ability to maintain clinical measurements within normal limits will improve 01/27/2021 0812 by Ansel Bong, RN Outcome: Adequate for Discharge 01/27/2021 0812 by Ansel Bong, RN Outcome: Adequate for Discharge Goal: Will remain free from infection 01/27/2021 0812 by Ansel Bong, RN Outcome: Adequate for Discharge 01/27/2021 0812 by Ansel Bong, RN Outcome: Adequate for Discharge Goal: Diagnostic test results will improve 01/27/2021 0812 by Ansel Bong, RN Outcome: Adequate for Discharge 01/27/2021 0812 by Ansel Bong, RN Outcome: Adequate for Discharge Goal: Respiratory complications will improve 01/27/2021 0812 by Ansel Bong, RN Outcome: Adequate for Discharge 01/27/2021 0812 by Ansel Bong, RN Outcome: Adequate for Discharge Goal: Cardiovascular complication will be avoided 01/27/2021 0812 by Ansel Bong, RN Outcome: Adequate for Discharge 01/27/2021 0812 by Ansel Bong, RN Outcome: Adequate for Discharge   Problem: Activity: Goal: Risk for activity intolerance will decrease 01/27/2021 0812 by Ansel Bong, RN Outcome: Adequate for Discharge 01/27/2021 [phone] by Ansel Bong, RN Outcome: Adequate for Discharge   Problem: Nutrition: Goal: Adequate nutrition will be maintained 01/27/2021 0812 by Ansel Bong, RN Outcome: Adequate for  Discharge 01/27/2021 0812 by Ansel Bong, RN Outcome: Adequate for Discharge   Problem: Coping: Goal: Level of anxiety will decrease 01/27/2021 0812 by Ansel Bong, RN Outcome: Adequate for Discharge 01/27/2021 0812 by Ansel Bong, RN Outcome: Adequate for Discharge   Problem: Elimination: Goal: Will not experience complications related to bowel motility 01/27/2021 0812 by Ansel Bong, RN Outcome: Adequate for Discharge 01/27/2021 0812 by Ansel Bong, RN Outcome: Adequate for Discharge Goal: Will not experience complications related to urinary retention 01/27/2021 0812 by Ansel Bong, RN Outcome: Adequate for Discharge 01/27/2021 0812 by Ansel Bong, RN Outcome: Adequate for Discharge   Problem: Pain Managment: Goal: General experience of comfort will improve 01/27/2021 0812 by Ansel Bong, RN Outcome: Adequate for Discharge 01/27/2021 0812 by Ansel Bong, RN Outcome: Adequate for Discharge   Problem: Safety: Goal: Ability to remain free from injury will improve 01/27/2021 0812 by Ansel Bong, RN Outcome: Adequate for Discharge 01/27/2021 0812 by Ansel Bong, RN Outcome: Adequate for Discharge   Problem: Skin Integrity: Goal: Risk for impaired skin integrity will decrease 01/27/2021 0812 by Ansel Bong, RN Outcome: Adequate for Discharge 01/27/2021 0812 by Ansel Bong, RN Outcome: Adequate for Discharge

## 2021-01-27 NOTE — Progress Notes (Signed)
Per Pt to call Daughter Zoe Lan [phone] for any emergency. Will  continue to monitor.

## 2021-01-27 NOTE — Discharge Summary (Signed)
Physician Discharge Summary  Patient ID: Julie Singleton MRN: [id number] DOB/AGE: 10-12-1955 65 y.o.  Admit date: 01/25/2021 Discharge date: 01/27/2021  Admission Diagnoses:  Discharge Diagnoses:  Principal Problem:   Atrial fibrillation with RVR (HCC) Active Problems:   Hypertension   Depression   Atrial fibrillation with rapid ventricular response (HCC) New onset atrial fibrillation with rapid ventricle response  Discharged Condition: good  Hospital Course:  Julie Singleton is a 65 y.o. female with medical history significant for hypertension and status post AAA repair in 2014 who presents to the ER for evaluation of a 3-day history of palpitations.  Patient states that she had fluttering and palpitations in her chest which has been persistent but more pronounced on the day of admission.  Palpitation is associated with shortness of breath and chest tightness patient has had no chest pain.   Patient was initially treated with diltiazem drip, also started Eliquis.  Patient is seen by cardiology, transition to oral diltiazem.  She also received 0.25 mg IV digoxin. Per cardiology recommendation, I walked the patient, heart rate went up to 140, patient is asymptomatic.  Currently, patient is still asymptomatic.  Currently, patient heart rate is 110-120.  Patient is adamant that she will not stay for another day in the hospital.  Dr. Lady Gary had cleared patient for discharge.  At this point, I will give patient continued on diltiazem 240 mg daily, metoprolol increased to 50 mg twice a day. She was not discharged yesterday due to persistent tachycardia with heart rate around 130s.  Overnight, she converted to sinus.  At this point, we will continue diltiazem metoprolol, I have advised patient to stop taking digoxin.  At this point she is medically stable to be discharged. Patient be followed by cardiology and PCP at the near future.     Consults: cardiology  Significant Diagnostic Studies:   Echo: 1. Left ventricular ejection fraction, by estimation, is 50 to 55%. The left ventricle has low normal function. The left ventricle demonstrates global hypokinesis. There is moderate left ventricular hypertrophy. Left ventricular diastolic parameters are consistent with Grade I diastolic dysfunction (impaired relaxation). 2. Right ventricular systolic function is normal. The right ventricular size is mildly enlarged. 3. Left atrial size was mildly dilated. 4. Right atrial size was mildly dilated. 5. The mitral valve is grossly normal. Mild to moderate mitral valve regurgitation. 6. The aortic valve is grossly normal. Aortic valve regurgitation is mild. 7. 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 There is mild dilatation of the aortic root and of the ascending aorta, measuring 37 mm. Treatments: Diltiazem, digoxin, metoprolol  Discharge Exam: Blood pressure (!) 141/78, pulse 83, temperature (!) 97.5 F (36.4 C), resp. rate 20, height 5\' 10"  (1.778 m), weight 99.1 kg, SpO2 94 %. General appearance: alert and cooperative Resp: clear to auscultation bilaterally Cardio: regular rate and rhythm, S1, S2 normal, no murmur, click, rub or gallop GI: soft, non-tender; bowel sounds normal; no masses,  no organomegaly Extremities: extremities normal, atraumatic, no cyanosis or edema  Disposition: Discharge disposition: 01-Home or Self Care       Discharge Instructions     Amb referral to [facility]   Complete by: As directed    Diet - low sodium heart healthy   Complete by: As directed    Increase activity slowly   Complete by: As directed       Allergies as of 01/27/2021   No Known Allergies      Medication List  STOP taking these medications    chlorthalidone 25 MG tablet Commonly known as: HYGROTON   lisinopril 30 MG tablet Commonly known as: ZESTRIL       TAKE these medications    apixaban 5 MG Tabs tablet Commonly  known as: ELIQUIS Take 1 tablet (5 mg total) by mouth 2 (two) times daily.   aspirin EC 81 MG tablet Take 81 mg by mouth daily.   diltiazem 240 MG 24 hr capsule Commonly known as: CARDIZEM CD Take 1 capsule (240 mg total) by mouth daily.   metoprolol tartrate 50 MG tablet Commonly known as: LOPRESSOR Take 1 tablet (50 mg total) by mouth 2 (two) times daily. What changed:  medication strength how much to take   sertraline 25 MG tablet Commonly known as: ZOLOFT Take 25 mg by mouth daily.   simvastatin 40 MG tablet Commonly known as: ZOCOR Take 40 mg by mouth at bedtime.        Follow-up Information     Dalia Heading, MD Follow up in 1 week(s).   Specialty: Cardiology Contact information: [address] [place] [address] [phone]         [facility] [employer] Follow up in 1 week(s).   Contact information: [address] [place] [address] [phone]                 Signed: Marrion Coy 01/27/2021, 8:54 AM

## 2021-01-27 NOTE — Progress Notes (Signed)
This RN provided discharge instructions and teaching to the patient. The patient verbalized and demonstrated understanding of the provided instructions. All outstanding questions resolved. L hand PIV removed. Cannula intact. Pt tolerated well. All belongings packed and in tow. Volunteer service to transport patient to private vehicle at time of departure.

## 2022-11-29 ENCOUNTER — Ambulatory Visit: Admission: EM | Admit: 2022-11-29 | Discharge: 2022-11-29 | Payer: Medicare PPO

## 2022-11-29 ENCOUNTER — Encounter: Payer: Self-pay | Admitting: Emergency Medicine

## 2022-11-29 DIAGNOSIS — I4891 Unspecified atrial fibrillation: Secondary | ICD-10-CM | POA: Diagnosis not present

## 2022-11-29 NOTE — ED Triage Notes (Signed)
Pt presents with SOB x 1 week and she believes she has been in A-fib for several weeks with increasing fatigue. Pt denies any other symptoms.

## 2022-11-29 NOTE — Discharge Instructions (Addendum)
Recommend you seek emergent evaluation at a local ED facility.

## 2022-11-29 NOTE — ED Provider Notes (Signed)
Renaldo Fiddler    CSN: [id number] Arrival date & time: 11/29/22  1438      History   Chief Complaint Chief Complaint  Patient presents with   Shortness of Breath    HPI Stevie A Storie is a 67 y.o. female.    Shortness of Breath   Patient is accompanied by a family member.  She presents to urgent care with complaint of shortness of breath x 1 week.  She believes she has been in A-fib for several weeks and reports increasing levels of fatigue.  Patient was discharged from [facility] on 01/27/2021 after being admitted for 3 days of palpitations.  She was initially treated with Dilt drip and started on Eliquis.  She was transition to oral Dilt and discharged with metoprolol twice daily.  She reports stable symptoms until several weeks ago when she again felt palpitations with increasing levels of fatigue.  She is status post AAA repair in 2014.  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      Home Medications    Prior to Admission medications   Medication Sig Start Date End Date Taking? Authorizing Provider  apixaban (ELIQUIS) 5 MG TABS tablet Take 1 tablet (5 mg total) by mouth 2 (two) times daily. 01/26/21  Yes Marrion Coy, MD  aspirin EC 81 MG tablet Take 81 mg by mouth daily.   Yes [provider]  chlorthalidone (HYGROTON) 25 MG tablet Take by mouth. 11/26/19  Yes [provider]  lisinopril (ZESTRIL) 30 MG tablet Take 1 tablet by mouth daily. 11/26/19  Yes [provider]  metoprolol tartrate (LOPRESSOR) 50 MG tablet Take 1 tablet (50 mg total) by mouth 2  (two) times daily. 01/26/21  Yes Marrion Coy, MD  sertraline (ZOLOFT) 25 MG tablet Take 25 mg by mouth daily.   Yes [provider]  simvastatin (ZOCOR) 40 MG tablet Take 40 mg by mouth at bedtime.    Yes [provider]  diltiazem (CARDIZEM CD) 240 MG 24 hr capsule Take 1 capsule (240 mg total) by mouth daily. 01/27/21   Marrion Coy, MD  magnesium oxide (MAG-OX) 400 MG tablet Take by mouth.    [provider]  digoxin (LANOXIN) 0.25 MG tablet Take 1 tablet (250 mcg total) by mouth daily for 3 days. 01/26/21 01/27/21  Marrion Coy, MD    Family History Family History  Problem Relation Age of Onset   Bladder Cancer Father     Social History Social History   Tobacco Use   Smoking status: Never   Smokeless tobacco: Never  Vaping Use   Vaping Use: Never used  Substance Use Topics   Alcohol use: Not Currently   Drug use: Never     Allergies   Patient has no known allergies.   Review of Systems Review of Systems  Respiratory:  Positive for shortness of breath.      Physical Exam Triage Vital Signs ED Triage Vitals  Enc Vitals Group     BP 11/29/22 1459 113/74     Pulse Rate 11/29/22  1459 92     Resp 11/29/22 1459 16     Temp 11/29/22 1459 97.9 F (36.6 C)     Temp Source 11/29/22 1459 Oral     SpO2 11/29/22 1459 95 %     Weight --      Height --      Head Circumference --      Peak Flow --      Pain Score 11/29/22 1454 0     Pain Loc --      Pain Edu? --      Excl. in GC? --    No data found.  Updated Vital Signs BP 113/74 (BP Location: Right Arm)   Pulse 92 Comment: irregular  Temp 97.9 F (36.6 C) (Oral)   Resp 16   SpO2 95%   Visual Acuity Right Eye Distance:   Left Eye Distance:   Bilateral Distance:    Right Eye Near:   Left Eye Near:    Bilateral Near:     Physical Exam Vitals reviewed.  Constitutional:      General: She is not in acute distress.    Appearance: She is ill-appearing.  Cardiovascular:     Rate  and Rhythm: Tachycardia present. Rhythm irregular.  Pulmonary:     Effort: Pulmonary effort is normal.     Breath sounds: Normal breath sounds.  Skin:    General: Skin is warm and dry.  Neurological:     General: No focal deficit present.     Mental Status: She is alert and oriented to person, place, and time.  Psychiatric:        Mood and Affect: Mood normal.        Behavior: Behavior normal.      UC Treatments / Results  Labs (all labs ordered are listed, but only abnormal results are displayed) Labs Reviewed - No data to display  EKG   Radiology No results found.  Procedures Procedures (including critical care time)  Medications Ordered in UC Medications - No data to display  Initial Impression / Assessment and Plan / UC Course  I have reviewed the triage vital signs and the nursing notes.  Pertinent labs & imaging results that were available during my care of the patient were reviewed by me and considered in my medical decision making (see chart for details).   Jalaina A Amaya is a 66 y.o. female presenting with a-fib and SOB. Patient is afebrile without recent antipyretics, satting well on room air. Overall is ill appearing though non-toxic, well hydrated, without respiratory distress. Pulmonary exam is unremarkable.  Lungs CTAB without wheezing, rhonchi, rales.  She has irregular rhythm and is tachycardic.  ECG shows A-fib with RVR.  Ventricular rate of 138 bpm.  Recommended patient transfer her care to ED where she can be cardioverted or emergent treatment for A-fib administered.  Patient states she will go to [facility].    Final Clinical Impressions(s) / UC Diagnoses   Final diagnoses:  Atrial fibrillation with rapid ventricular response [facility] [place])   Discharge Instructions   None    ED Prescriptions   None    PDMP not reviewed this encounter.   Charma Igo, [place] 11/29/22 1538

## 2022-11-29 NOTE — ED Notes (Signed)
Patient is being discharged from the Urgent Care and sent to the Emergency Department via personal vehicle. Per Jeannett Senior Immordino, patient is in need of higher level of care due to SOB and A-fib. Patient is aware and verbalizes understanding of plan of care.  Vitals:   11/29/22 1459  BP: 113/74  Pulse: 92  Resp: 16  Temp: 97.9 F (36.6 C)  SpO2: 95%

## 2022-12-01 DIAGNOSIS — I5020 Unspecified systolic (congestive) heart failure: Secondary | ICD-10-CM | POA: Insufficient documentation

## 2023-06-06 IMAGING — DX DG CHEST 1V PORT
1 series · 1 of 1 positions shown · non-contrast
Comparison: 05/28/2013

CLINICAL DATA: Shortness of breath

EXAM:
PORTABLE CHEST 1 VIEW

[chest ap]
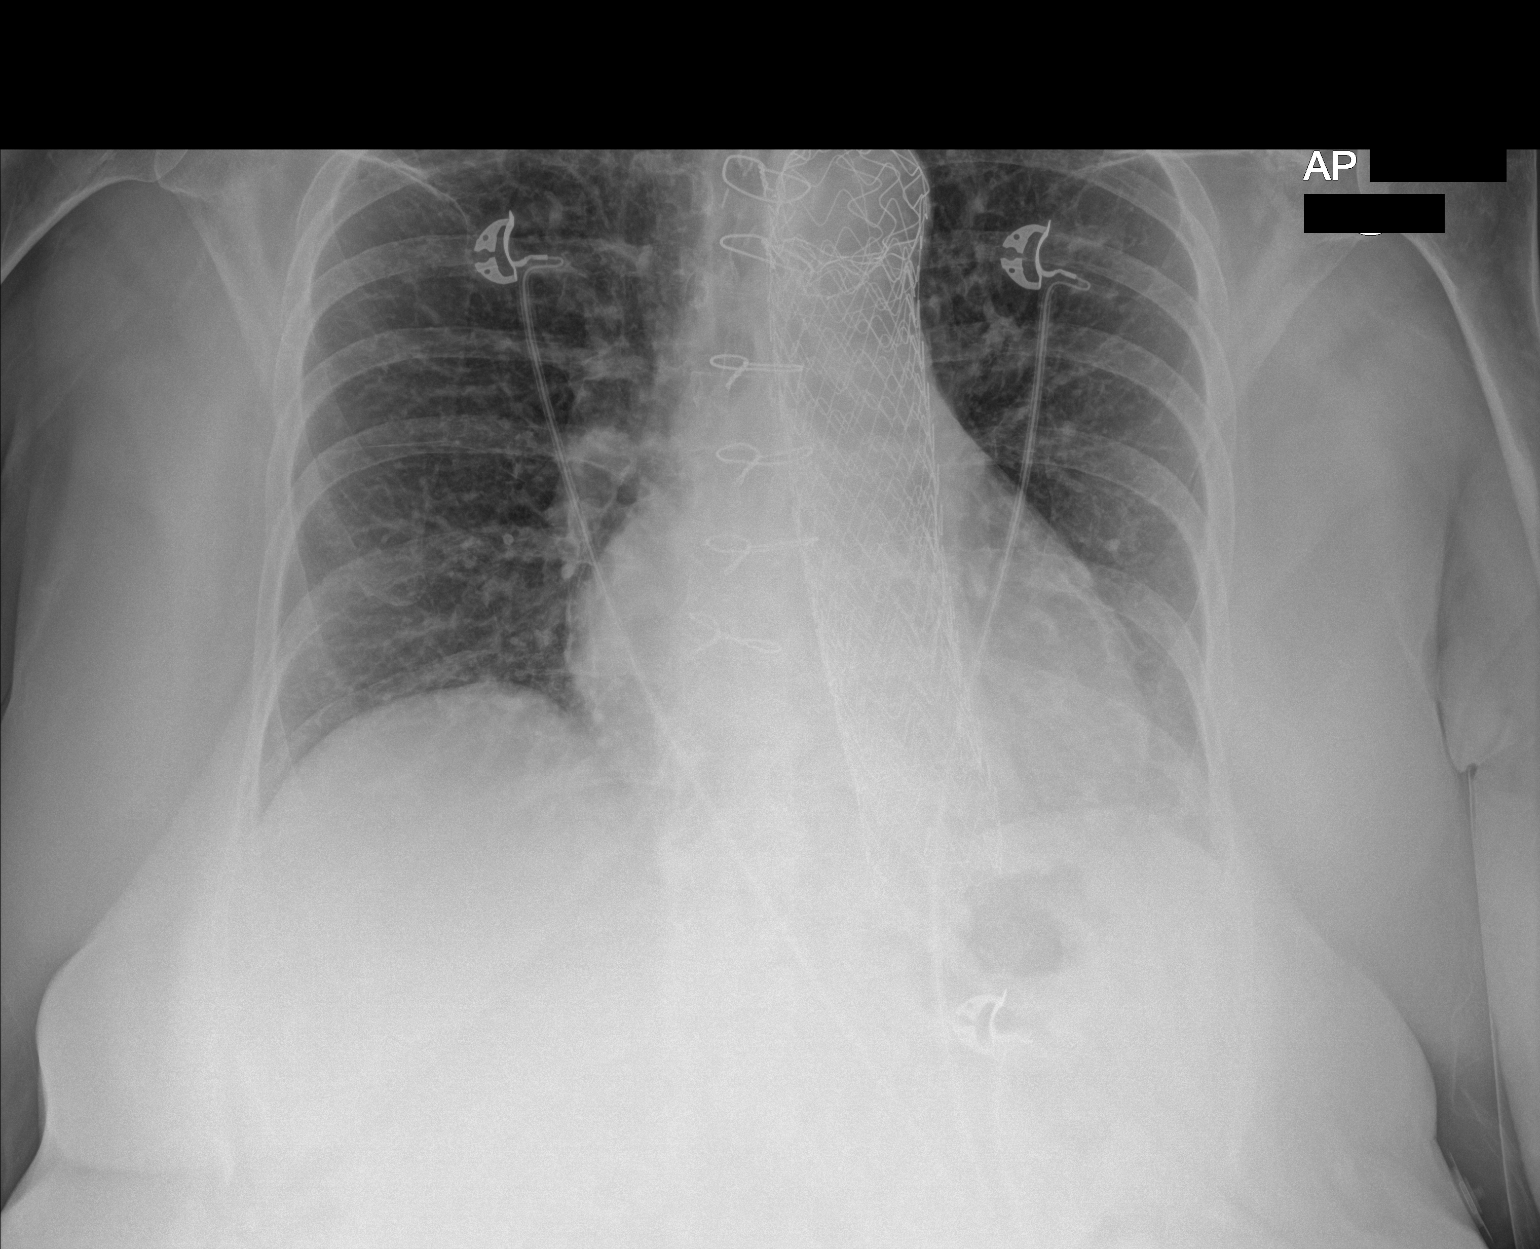

[1 of 1 positions shown; findings below may reference images not displayed]

FINDINGS: Cardiac shadow is mildly prominent. Aortic stent graft is noted in
the descending thoracic aorta consistent with prior dissection and
therapy. The lungs are clear bilaterally. No focal infiltrate or
effusion is noted. No bony abnormality is seen.
IMPRESSION: Thoracic aortic stent graft in satisfactory position.

No acute abnormality noted.

## 2024-03-12 DIAGNOSIS — G4733 Obstructive sleep apnea (adult) (pediatric): Secondary | ICD-10-CM | POA: Insufficient documentation

## 2024-05-09 NOTE — Progress Notes (Unsigned)
 Sleep Medicine   Office Visit  Patient Name: Julie Singleton DOB: 1956/04/17 MRN [id number]    Chief Complaint: sleep evaluation  Brief History:  Julie Singleton presents for an initial consult for sleep evaluation and to establish care. Patient has a 7 month history of sleep apnea but has not been set up with a machine. Sleep quality is fair. This is noted most nights. The patient's bed partner reports snoring at night. The patient relates the following symptoms: headaches, fatigue, trouble concentrating and brain fog are also present. The patient goes to sleep between 0900 pm and midnight and wakes up at 1000 am. Patient reports waking up at least one times in between and has no trouble returning to sleep. Sleep quality is worse when outside home environment.  Patient has noted no significant movement of her legs at night that would disrupt her sleep.  The patient reports no unusual behavior during the night.  The patient reports depression as a history of psychiatric problems. The Epworth Sleepiness Score is 4 out of 24.  The patient reports cardiovascular risk factors as following: A-fib.   ROS  General: (-) fever, (-) chills, (-) night sweat Nose and Sinuses: (-) nasal stuffiness or itchiness, (-) postnasal drip, (-) nosebleeds, (-) sinus trouble. Mouth and Throat: (-) sore throat, (-) hoarseness. Neck: (-) swollen glands, (-) enlarged thyroid , (-) neck pain. Respiratory: - cough, + shortness of breath, - wheezing. Neurologic: - numbness, - tingling. Psychiatric: - anxiety, - depression Sleep behavior: -sleep paralysis -hypnogogic hallucinations -dream enactment      -vivid dreams -cataplexy -night terrors -sleep walking   Current Medication: Outpatient Encounter Medications as of 05/12/2024  Medication Sig   amiodarone (PACERONE) 200 MG tablet Take 200 mg by mouth daily.   empagliflozin (JARDIANCE) 10 MG TABS tablet Take 10 mg by mouth daily.   furosemide (LASIX) 40 MG tablet Take 40 mg  by mouth daily.   metoprolol  tartrate (LOPRESSOR ) 25 MG tablet Take 25 mg by mouth.   potassium chloride SA (KLOR-CON M) 20 MEQ tablet Take 20 mEq by mouth daily.   simvastatin  (ZOCOR ) 20 MG tablet Take 20 mg by mouth.   spironolactone (ALDACTONE) 25 MG tablet Take 25 mg by mouth daily.   apixaban  (ELIQUIS ) 5 MG TABS tablet Take 1 tablet (5 mg total) by mouth 2 (two) times daily.   aspirin EC 81 MG tablet Take 81 mg by mouth daily.   chlorthalidone (HYGROTON) 25 MG tablet Take by mouth.   diltiazem  (CARDIZEM  CD) 240 MG 24 hr capsule Take 1 capsule (240 mg total) by mouth daily.   magnesium  oxide (MAG-OX) 400 MG tablet Take by mouth.   metoprolol  tartrate (LOPRESSOR ) 50 MG tablet Take 1 tablet (50 mg total) by mouth 2 (two) times daily.   sertraline  (ZOLOFT ) 25 MG tablet Take 25 mg by mouth daily.   [DISCONTINUED] digoxin  (LANOXIN ) 0.25 MG tablet Take 1 tablet (250 mcg total) by mouth daily for 3 days.   [DISCONTINUED] lisinopril (ZESTRIL) 30 MG tablet Take 1 tablet by mouth daily.   [DISCONTINUED] simvastatin  (ZOCOR ) 40 MG tablet Take 40 mg by mouth at bedtime.    No facility-administered encounter medications on file as of 05/12/2024.    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      Medical History: Past Medical History:  Diagnosis Date   Hypertension     Family History: Non contributory to the present illness  Social  History: Social History   Socioeconomic History   Marital status: Single    Spouse name: Not on file   Number of children: Not on file   Years of education: Not on file   Highest education level: Not on file  Occupational History   Not on file  Tobacco Use   Smoking status: Former    Types: Cigarettes   Smokeless tobacco: Never  Vaping Use   Vaping status: Never Used  Substance and Sexual Activity   Alcohol use: Not Currently   Drug use: Never   Sexual activity: Not  on file  Other Topics Concern   Not on file  Social History Narrative   Not on file   Social Drivers of Health   Financial Resource Strain: Low Risk  (08/23/2022)   Received from [facility]   Overall Financial Resource Strain (CARDIA)    Difficulty of Paying Living Expenses: Not hard at all  Food Insecurity: No Food Insecurity (08/23/2022)   Received from [facility]   Hunger Vital Sign    Within the past 12 months, you worried that your food would run out before you got the money to buy more.: Never true    Within the past 12 months, the food you bought just didn't last and you didn't have money to get more.: Never true  Transportation Needs: No Transportation Needs (08/23/2022)   Received from [facility]   PRAPARE - Transportation    Lack of Transportation (Medical): No    Lack of Transportation (Non-Medical): No  Physical Activity: Not on file  Stress: Not on file  Social Connections: Moderately Isolated (10/22/2020)   Received from [facility]   Social Connection and Isolation Panel    In a typical week, how many times do you talk on the phone with family, friends, or neighbors?: Three times a week    How often do you get together with friends or relatives?: Three times a week    How often do you attend church or religious services?: 1 to 4 times per year    Do you belong to any clubs or organizations such as church groups, unions, fraternal or athletic groups, or school groups?: No    How often do you attend meetings of the clubs or organizations you belong to?: Never    Are you married, widowed, divorced, separated, never married, or living with a partner?: Divorced  Intimate Partner Violence: Not on file    Vital Signs: Blood pressure 138/74, pulse 60, resp. rate 16, height 5' 10 (1.778 m), weight 209 lb (94.8 kg), SpO2 98%. Body mass index is 29.99 kg/m.   Examination: General Appearance: The patient is well-developed, well-nourished, and in no  distress. Neck Circumference: 44 cm Skin: Gross inspection of skin unremarkable. Head: normocephalic, no gross deformities. Eyes: no gross deformities noted. ENT: ears appear grossly normal Neurologic: Alert and oriented. No involuntary movements.    STOP BANG RISK ASSESSMENT S (snore) Have you been told that you snore?     YES   T (tired) Are you often tired, fatigued, or sleepy during the day?   YES  O (obstruction) Do you stop breathing, choke, or gasp during sleep? YES   P (pressure) Do you have or are you being treated for high blood pressure? YES   B (BMI) Is your body index greater than 35 kg/m? NO   A (age) Are you 37 years old or older? YES   N (neck) Do  you have a neck circumference greater than 16 inches?   YES   G (gender) Are you a female? NO   TOTAL STOP/BANG "YES" ANSWERS 6                                                               A STOP-Bang score of 2 or less is considered low risk, and a score of 5 or more is high risk for having either moderate or severe OSA. For people who score 3 or 4, doctors may need to perform further assessment to determine how likely they are to have OSA.         EPWORTH SLEEPINESS SCALE:  Scale:  (0)= no chance of dozing; (1)= slight chance of dozing; (2)= moderate chance of dozing; (3)= high chance of dozing  Chance  Situtation    Sitting and reading: 1    Watching TV: 0    Sitting Inactive in public: 0    As a passenger in car: 1      Lying down to rest: 1    Sitting and talking: 0    Sitting quielty after lunch: 1    In a car, stopped in traffic: 0   TOTAL SCORE:   4 out of 24    SLEEP STUDIES:  1. HST (09/2023 at [facility] AHI 25.5/hr, min SpO2 82%   LABS: No results found for this or any previous visit (from the past 2160 hours).  Radiology: No results found.  No results found.  No results found.    Assessment and Plan: Patient Active Problem List   Diagnosis Date Noted   OSA (obstructive  sleep apnea) 03/12/2024   Heart failure with reduced ejection fraction (HCC) 12/01/2022   Atrial fibrillation with rapid ventricular response (HCC) 01/26/2021   Atrial fibrillation with RVR (HCC) 01/25/2021   Hypertension    Depression    1. OSA (obstructive sleep apnea) (Primary) Patient evaluation suggests high risk of sleep disordered breathing due to OSA on home sleep study from march 2025. Patient has snoring, headaches, fatigue and brain fog.  Patient has comorbid cardiovascular risk factors including: atrial fibrillation and reduced ejection fraction with heart failure  which could be exacerbated by pathologic sleep-disordered breathing.  Suggest: PSG to assess/treat the patient's sleep disordered breathing. The patient was also counselled on weight loss to optimize sleep health.   2. Atrial fibrillation with RVR (HCC) Continue amiodarone and eliquis  and follow up with cardiology as scheduled.   3. Heart failure with reduced ejection fraction [facility]) Patient is stable, continue follow up with cardiology. EF is 40%.     General Counseling: I have discussed the findings of the evaluation and examination with Julie Singleton.  I have also discussed any further diagnostic evaluation thatmay be needed or ordered today. Julie Singleton verbalizes understanding of the findings of todays visit. We also reviewed her medications today and discussed drug interactions and side effects including but not limited excessive drowsiness and altered mental states. We also discussed that there is always a risk not just to her but also people around her. she has been encouraged to call the office with any questions or concerns that should arise related to todays visit.  No orders of the defined types were placed in this encounter.  I have personally obtained a history, evaluated the patient, evaluated pertinent data, formulated the assessment and plan and placed orders.  This patient was seen today by Lauraine Lay, PA-C in collaboration with Dr. Elfreda Bathe.    Elfreda DELENA Bathe, MD [facility] Diplomate ABMS Pulmonary and Critical Care Medicine Sleep medicine

## 2024-05-12 ENCOUNTER — Ambulatory Visit (INDEPENDENT_AMBULATORY_CARE_PROVIDER_SITE_OTHER): Payer: Self-pay | Admitting: Internal Medicine

## 2024-05-12 VITALS — BP 138/74 | HR 60 | Resp 16 | Ht 70.0 in | Wt 209.0 lb

## 2024-05-12 DIAGNOSIS — G4733 Obstructive sleep apnea (adult) (pediatric): Secondary | ICD-10-CM | POA: Diagnosis not present

## 2024-05-12 DIAGNOSIS — I4891 Unspecified atrial fibrillation: Secondary | ICD-10-CM | POA: Diagnosis not present

## 2024-05-12 DIAGNOSIS — I5020 Unspecified systolic (congestive) heart failure: Secondary | ICD-10-CM

## 2024-05-12 NOTE — Patient Instructions (Signed)
 Living With Sleep Apnea Sleep apnea is a condition that affects your breathing while you're sleeping. Your tongue or the tissue in your throat may block the flow of air while you sleep. You may have shallow breathing or stop breathing for short periods of time. The breaks in breathing interrupt the deep sleep that you need to feel rested. Even if you don't wake up from the gaps in breathing, you may feel tired during the day. People with sleep apnea may snore loudly. You may have a headache in the morning and feel anxious or depressed. How can sleep apnea affect me? Sleep apnea increases your chances of being very tired during the day. This is called daytime fatigue. Sleep apnea can also increase your risk of: Heart attack. Stroke. Obesity. Type 2 diabetes. Heart failure. Irregular heartbeat. High blood pressure. If you are very tired during the day, you may be more likely to: Not do well in school or at work. Fall asleep while driving. Have trouble paying attention. Develop depression or anxiety. Have problems having sex. This is called sexual dysfunction. What actions can I take to manage sleep apnea? Sleep apnea treatment  If you were given a device to open your airway while you sleep, use it only as told by your health care provider. You may be given: An oral appliance. This is a mouthpiece that shifts your lower jaw forward. A continuous positive airway pressure (CPAP) device. This blows air through a mask. A nasal expiratory positive airway pressure (EPAP) device. This has valves that you put into each nostril. A bi-level positive airway pressure (BIPAP) device. This blows air through a mask when you breathe in and breathe out. You may need surgery if other treatments don't work for you. Sleep habits Go to sleep and wake up at the same time every day. This helps set your internal clock for sleeping. If you stay up later than usual on weekends, try to get up in the morning within 2  hours of the time you usually wake up. Try to get at least 7-9 hours of sleep each night. Stop using a computer, tablet, and mobile phone a few hours before bedtime. Do not take long naps during the day. If you nap, limit it to 30 minutes. Have a relaxing bedtime routine. Reading or listening to music may relax you and help you sleep. Use your bedroom only for sleep. Keep your television and computer out of your bedroom. Keep your bedroom cool, dark, and quiet. Use a supportive mattress and pillows. Follow your provider's instructions for other changes to sleep habits. Nutrition Do not eat big meals in the evening. Do not have caffeine in the later part of the day. The effects of caffeine can last for more than 5 hours. Follow your provider's instructions for any changes to what you eat and drink. Lifestyle Do not drink alcohol before bedtime. Alcohol can cause you to fall asleep at first, but then it can cause you to wake up in the middle of the night and have trouble getting back to sleep. Do not smoke, vape, or use nicotine or tobacco. Medicines Take over-the-counter and prescription medicines only as told by your provider. Do not use over-the-counter sleep medicine. You may become dependent on this medicine, and it can make sleep apnea worse. Do not take medicines, such as sedatives and narcotics, unless told to by your provider. Activity Exercise on most days, but avoid exercising in the evening. Exercising near bedtime can interfere with sleeping.  If possible, spend time outside every day. Natural light helps with your internal clock. General information Lose weight if you need to. Stay at a healthy weight. If you are having surgery, make sure to tell your provider that you have sleep apnea. You may need to bring your device with you. Keep all follow-up visits. Your provider will want to check on your condition. Where to find more information National Heart, Lung, and Blood  Institute: [employer] This information is not intended to replace advice given to you by your health care provider. Make sure you discuss any questions you have with your health care provider. Document Revised: 10/25/2022 Document Reviewed: 10/25/2022 Elsevier Patient Education  2024 [employer].

## 2024-05-28 ENCOUNTER — Encounter: Payer: Self-pay | Admitting: Internal Medicine

## 2024-05-28 DIAGNOSIS — G4733 Obstructive sleep apnea (adult) (pediatric): Secondary | ICD-10-CM | POA: Diagnosis not present

## 2024-06-10 NOTE — Procedures (Signed)
 [facility]  Polysomnogram Report Part I                                                               Phone: [phone] Fax: [phone]  Patient Name: Julie Singleton, Julie Singleton. Acquisition Number: [id number]  Date of Birth: 10-09-1955 Acquisition Date: 05/28/2024  Referring Physician: Calton Helena, AGNP     History: The patient is a 68 year old  who was referred for evaluation of possible sleep apnea with snoring and excessive daytime sleepiness. Medical History: headaches, fatigue, trouble concentrating, brain fog, OSA, AFIB RVR, heart failure w/ reduced ejection fraction, hypertension, depression.  Medications: .acetaminophen , amiodarone, amoxicillin, apixaban , empagliflozin, furosemide, magnesium  oxide, metoprolol  succinate, metoprolol  tartrate, potassium chloride, sertraline , simvastatin , spironolactone.  Procedure: This routine overnight polysomnogram was performed on the [facility] 5 using the standard diagnostic protocol. This included 6 channels of EEG, 2 channels of EOG, chin EMG, bilateral anterior tibialis EMG, nasal/oral thermistor, PTAF (nasal pressure transducer), chest and abdominal wall movements, EKG, and pulse oximetry.  Description: The total recording time was 408.0 minutes. The total sleep time was 255.0 minutes. There were a total of 54.0 minutes of wakefulness after sleep onset for areducedsleep efficiency of 62.5%. The latency to sleep onset was prolonged at 99.0 minutes. The R sleep onset latency was prolonged at 257.5 minutes. Sleep parameters, as a percentage of the total sleep time, demonstrated 3.3% of sleep was in N1 sleep, 49.8% N2, 27.8% N3 and 19.0% R sleep. There were a total of 185 arousals for an arousal index of 43.5 arousals per hour of sleep that was elevated.  Respiratory monitoring demonstrated   snoring. Supine sleep was not observed. There were 40 apneas and hypopneas for an Apnea Hypopnea Index of 9.4 apneas and hypopneas per hour of sleep. The REM  related apnea hypopnea index was 37.1/hr of REM sleep compared to a NREM AHI of 2.9/hr.  The average duration of the respiratory events was 17.6 seconds with a maximum duration of 28.0 seconds. The respiratory events were associated with peripheral oxygen desaturations on the average to 87%. The lowest oxygen desaturation associated with a respiratory event was 83%. Additionally, the baseline oxygen saturation during wakefulness was 93%, during NREM sleep averaged 91%, and during REM sleep averaged 90%. The total duration of oxygen < 90% was 44.9 minutes and <80% was 0.1 minutes.  Cardiac monitoring-  demonstrate transient cardiac decelerations associated with the apneas.  significant cardiac rhythm irregularities.   Periodic limb movement monitoring- demonstrated that there were 597 periodic limb movements for a periodic limb movement index of 140.5 periodic limb movements per hour of sleep. Very frequent, quasi-periodic limb movements were observed during periods of wakefulness.  Impression: This routine overnight polysomnogram demonstrated significant, REM-related obstructive sleep apnea with an overall Apnea Hypopnea Index of 9.4 apneas and hypopneas per hour of sleep which increased to 37.1 in REM sleep. The lowest desaturation was to 83%. The findings may underestimate the severity of the sleep apnea as supine sleep was not observed.    There was a highly elevated periodic limb movement index of 140.5 periodic limb movements per hour of sleep. In addition, very frequent, quasi-periodic limb movements were observed during periods of wakefulness. Sometimes these limb movements subside once the apnea is controlled. Clinical  correlation is suggested.  reduced sleep efficiency with an elevated arousal index. These findings would appear to be due to the combination of obstructive sleep apnea and periodic limb movements.   Recommendations:    A CPAP titration would be recommended for the sleep apnea.  Some supine sleep should be ensured to optimize the titration. Would recommend weight loss in a patient with a BMI of 30.0.  Alternative treatment options may include an oral appliance, a nasal resistance device, or ENT surgery in the appropriate clinical context.     Elfreda RONAL Bathe, MD, [facility] Diplomate ABMS-Pulmonary, Critical Care and Sleep Medicine  Electronically reviewed and digitally signed  [facility] Polysomnogram Report Part II  Phone: [phone] Fax: [phone]  Patient last name Knoedler Neck Size 13.0 in. Acquisition [id number]  Patient first name Marcia A. Weight 209.0 lbs. Started 05/28/2024 at 10:22:04 PM  Birth date 1956/05/31 Height 70.0 in. Stopped 05/29/2024 at 5:21:16 AM  Age 45 BMI 30.0 lb./in2 Duration 408.0  Study Type Adult      Raford Sprang, RPSGT / Harlene Kemp  Reviewed by: Kathe G. Henke, PhD, ABSM, FAASM Sleep Data: Lights Out: 10:29:04 PM Sleep Onset: 12:08:04 AM  Lights On: 5:17:04 AM Sleep Efficiency: 62.5 %  Total Recording Time: 408.0 min Sleep Latency (from Lights Off) 99.0 min  Total Sleep Time (TST): 255.0 min R Latency (from Sleep Onset): 257.5 min  Sleep Period Time: 308.5 min Total number of awakenings: 10  Wake during sleep: 53.5 min Wake After Sleep Onset (WASO): 54.0 min   Sleep Data:         Arousal Summary: Stage  Latency from lights out (min) Latency from sleep onset (min) Duration (min) % Total Sleep Time  Normal values  N 1 99.0 0.0 8.5 3.3 (5%)  N 2 100.0 1.0 127.0 49.8 (50%)  N 3 127.5 28.5 71.0 27.8 (20%)  R 356.5 257.5 48.5 19.0 (25%)   Number Index  Spontaneous 54 12.7  Apneas & Hypopneas 2 0.5  RERAs 0 0.0       (Apneas & Hypopneas & RERAs)  (2) (0.5)  Limb Movement 129 30.4  Snore 0 0.0  TOTAL 185 43.5     Respiratory Data:  CA OA MA Apnea Hypopnea* A+ H RERA Total  Number 0 0 0 0 40 40 0 40  Mean Dur (sec) 0.0 0.0 0.0 0.0 17.6 17.6 0.0 17.6  Max Dur (sec) 0.0 0.0 0.0 0.0 28.0 28.0 0.0 28.0   Total Dur (min) 0.0 0.0 0.0 0.0 11.7 11.7 0.0 11.7  % of TST 0.0 0.0 0.0 0.0 4.6 4.6 0.0 4.6  Index (#/h TST) 0.0 0.0 0.0 0.0 9.4 9.4 0.0 9.4  *Hypopneas scored based on 4% or greater desaturation.  Sleep Stage:        REM NREM TST  AHI 37.1 2.9 9.4  RDI 37.1 2.9 9.4           Body Position Data:  Sleep (min) TST (%) REM (min) NREM (min) CA (#) OA (#) MA (#) HYP (#) AHI (#/h) RERA (#) RDI (#/h) Desat (#)  Supine 0.0 0.00 0.0 0.0 0 0 0 0 0.0 0 0.00 0  Non-Supine 255.00 100.00 48.50 206.50 0.00 0.00 0.00 40.00 9.41 0 9.41 42.00  Left: 154.5 60.59 48.5 106.0 0 0 0 38 14.8 0 14.8 35  Right: 100.5 39.41 0.0 100.5 0 0 0 2 1.2 0 1.2 7     Snoring: Total number of snoring episodes  0  Total time with snoring    min (   % of sleep)   Oximetry Distribution:             WK REM NREM TOTAL  Average (%)   93 90 91 92  < 90% 5.1 19.3 20.5 44.9  < 80% 0.1 0.0 0.0 0.1  < 70% 0.0 0.0 0.0 0.0  # of Desaturations* 2 28 12  42  Desat Index (#/hour) 0.8 34.6 3.5 9.9  Desat Max (%) 4 10 6 10   Desat Max Dur (sec) 13.0 57.0 35.0 57.0  Approx Min O2 during sleep 83  Approx min O2 during a respiratory event 83  Was Oxygen added (Y/N) and final rate :    LPM  *Desaturations based on 4% or greater drop from baseline.   Cheyne Stokes Breathing: None Present   Heart Rate Summary:  Average Heart Rate During Sleep 59.5 bpm      Highest Heart Rate During Sleep (95th %) 68.0 bpm      Highest Heart Rate During Sleep 137 bpm      Highest Heart Rate During Recording (TIB) 213 bpm (artifact)   Heart Rate Observations: Event Type # Events   Bradycardia 0 Lowest HR Scored: N/A  Sinus Tachycardia During Sleep 0 Highest HR Scored: N/A  Narrow Complex Tachycardia 0 Highest HR Scored: N/A  Wide Complex Tachycardia 0 Highest HR Scored: N/A  Asystole 0 Longest Pause: N/A  Atrial Fibrillation 0 Duration Longest Event: N/A  Other Arrythmias   Type:    Periodic Limb Movement Data: (Primary  legs unless otherwise noted) Total # Limb Movement 608 Limb Movement Index 143.1  Total # PLMS 597 PLMS Index 140.5  Total # PLMS Arousals 124 PLMS Arousal Index 29.2  Percentage Sleep Time with PLMS 187. (73.6 % sleep)  Mean Duration limb movements (secs) 469.0

## 2024-06-25 ENCOUNTER — Encounter (INDEPENDENT_AMBULATORY_CARE_PROVIDER_SITE_OTHER): Admitting: Internal Medicine

## 2024-06-25 DIAGNOSIS — G4733 Obstructive sleep apnea (adult) (pediatric): Secondary | ICD-10-CM

## 2024-07-01 NOTE — Procedures (Signed)
 [facility]  Polysomnogram Report Part I  Phone: [phone] Fax: [phone]  Patient Name: Julie, Singleton Acquisition Number: [id number]  Date of Birth: [birth year] Acquisition Date: 06/25/2024  Referring Physician: Calton Merla BODILY     History: The patient is a 68 year old  . Medical History: headaches, fatigue, trouble concentrating, brain fog, OSA, AFIB RVR, heart failure w/ reduced ejection fraction, hypertension, depression.  Medications: acetaminophen , amiodarone, amoxicillin, apixaban , empagliflozin, furosemide, magnesium  oxide, metoprolol  succinate, metoprolol  tartrate, potassium chloride, sertraline , simvastatin , spironolactone   Procedure: This routine overnight polysomnogram was performed on the [facility] 5 using the standard CPAP protocol. This included 6 channels of EEG, 2 channels of EOG, chin EMG, bilateral anterior tibialis EMG, nasal/oral thermistor, PTAF (nasal pressure transducer), chest and abdominal wall movements, EKG, and pulse oximetry.  Description: The total recording time was 415.5 minutes. The total sleep time was 244.5 minutes. There were a total of 113.5 minutes of wakefulness after sleep onset for a poorsleep efficiency of 58.8%. The latency to sleep onset was prolonged at 57.5 minutes. The R sleep onset latency was prolonged at 286.0 minutes. Sleep parameters, as a percentage of the total sleep time, demonstrated 5.3% of sleep was in N1 sleep, 64.2% N2, 8.4% N3 and 22.1% R sleep. There were a total of 120 arousals for an arousal index of 29.4 arousals per hour of sleep that was elevated.  Overall, there were a total of 49 respiratory events for a respiratory disturbance index, which includes apneas, hypopneas and RERAs (increased respiratory effort) of 12.0 respiratory events per hour of sleep during the pressure titration.  was initiated at 4 cm H2O at lights out, 10:33 p.m. It was titrated in 1-2 cm increments, largely for REM-related obstructive  events to the final pressure of 15.0 cm of H2O. Obstructive events persisted in REM sleep.  Additionally, the baseline oxygen saturation during wakefulness was 95%, during NREM sleep averaged 93%, and during REM sleep averaged 94%. The total duration of oxygen < 90% was 10.4 minutes and <80% was 0.0 minutes.  Cardiac monitoring-  demonstrate transient cardiac decelerations associated with the apneas.  significant cardiac rhythm irregularities.   Periodic limb movement monitoring- demonstrated that there were 538 periodic limb movements for a periodic limb movement index of 132.0 periodic limb movements per hour of sleep. Frequent, quasi-periodic limb movements were observed during periods of wakefulness.  Impression: Due to limited sleep time and long delay to REM onset, this patient's obstructive sleep apnea was not controlled on CPAP in REM sleep at pressures up to 15 cm H2O.  There was a highly elevated periodic limb movement index of 132.0 periodic limb movements per hour of sleep. In addition, frequent, quasi-periodic limb movements were observed during periods of wakefulness. The limb movements were also observed during the prior PSG. Treatment may be indicated if sleep disruption or sleepiness persist once the patient is fully compliant with a therapeutic setting of CPAP.  Recommendations: CPAP was not adequately controlled up to a pressure of 15CWP Would recommend a repeat titration study quickly titrating to 15 cm H2O as tolerated then further as needed.    An AirFit F40 Medium mask, was used. Chin strap used during study- no. Humidifier used during study- yes.     Julie RONAL Bathe, Julie Singleton, Julie Singleton Diplomate ABMS-Pulmonary, Critical Care and Sleep Medicine  Electronically reviewed and digitally signed  [facility] CPAP/BIPAP Polysomnogram Report Part II Phone: [phone] Fax: [phone]  Patient last name Lyford Neck Size 13.0  in. Acquisition [id number]  Patient first name  Julie Singleton Weight 210.0 lbs. Started 06/25/2024 at 10:26:36 PM  Birth date Nov 28, 1955 Height 70.0 in. Stopped 06/26/2024 at 5:33:42 AM  Age 42      Type Adult BMI 30.1 lb/in2 Duration 415.5  Raford Sprang, RPSGT & Harlene Kemp  Reviewed by: Kathe G. Henke, PhD, ABSM, FAASM Sleep Data: Lights Out: 10:33:36 PM Sleep Onset: 11:31:06 PM  Lights On: 5:29:06 AM Sleep Efficiency: 58.8 %  Total Recording Time: 415.5 min Sleep Latency (from Lights Off) 57.5 min  Total Sleep Time (TST): 244.5 min R Latency (from Sleep Onset): 286.0 min  Sleep Period Time: 358.0 min Total number of awakenings: 16  Wake during sleep: 113.5 min Wake After Sleep Onset (WASO): 113.5 min   Sleep Data:         Arousal Summary: Stage  Latency from lights out (min) Latency from sleep onset (min) Duration (min) % Total Sleep Time  Normal values  N 1 57.5 0.0 13.0 5.3 (5%)  N 2 70.0 12.5 157.0 64.2 (50%)  N 3 210.0 152.5 20.5 8.4 (20%)  R 343.5 286.0 54.0 22.1 (25%)   Number Index  Spontaneous 19 4.7  Apneas & Hypopneas 8 2.0  RERAs 0 0.0       (Apneas & Hypopneas & RERAs)  (8) (2.0)  Limb Movement 98 24.0  Snore 0 0.0  TOTAL 125 30.7     Respiratory Data:  CA OA MA Apnea Hypopnea* A+ H RERA Total  Number 0 0 0 0 49 49 0 49  Mean Dur (sec) 0.0 0.0 0.0 0.0 19.0 19.0 0.0 19.0  Max Dur (sec) 0.0 0.0 0.0 0.0 44.0 44.0 0.0 44.0  Total Dur (min) 0.0 0.0 0.0 0.0 15.5 15.5 0.0 15.5  % of TST 0.0 0.0 0.0 0.0 6.3 6.3 0.0 6.3  Index (#/h TST) 0.0 0.0 0.0 0.0 12.0 12.0 0.0 12.0  *Hypopneas scored based on 4% or greater desaturation.  Sleep Stage:         REM NREM TST  AHI 18.9 9.8 12.0  RDI 18.9 9.8 12.0   Sleep (min) TST (%) REM (min) NREM (min) CA (#) OA (#) MA (#) HYP (#) AHI (#/h) RERA (#) RDI (#/h) Desat (#)  Supine 48.5 19.84 0.0 48.5 0 0 0 10 12.4 0 12.4 45  Non-Supine 196.00 80.16 54.00 142.00 0.00 0.00 0.00 39.00 11.94 0 11.94 72.00  Left: 190.5 77.91 54.0 136.5 0 0 0 39 12.3 0 12.3 70  Right:  5.5 2.25 0.0 5.5 0 0 0 0 0.0 0 0.00 2     Snoring: Total number of snoring episodes  0  Total time with snoring    min (   % of sleep)   Oximetry Distribution:             WK REM NREM TOTAL  Average (%)   95 94 93 94  < 90% 2.1 0.0 8.3 10.4  < 80% 0.0 0.0 0.0 0.0  < 70% 0.0 0.0 0.0 0.0  # of Desaturations* 9 21 88 118  Desat Index (#/hour) 3.2 23.3 27.7 29.0  Desat Max (%) 8 6 8 8   Desat Max Dur (sec) 98.0 112.0 107.0 112.0  Approx Min O2 during sleep 82  Approx min O2 during a respiratory event 82  Was Oxygen added (Y/N) and final rate :    LPM  *Desaturations based on 3% or greater drop from baseline.   Cheyne Stokes Breathing: None Present  Heart Rate Summary:  Average Heart Rate During Sleep 53.4 bpm      Highest Heart Rate During Sleep (95th %) 60.0 bpm      Highest Heart Rate During Sleep 164 bpm (artifact)  Highest Heart Rate During Recording (TIB) 221 bpm (artifact)   Heart Rate Observations: Event Type # Events   Bradycardia 0 Lowest HR Scored: N/A  Sinus Tachycardia During Sleep 0 Highest HR Scored: N/A  Narrow Complex Tachycardia 0 Highest HR Scored: N/A  Wide Complex Tachycardia 0 Highest HR Scored: N/A  Asystole 0 Longest Pause: N/A  Atrial Fibrillation 0 Duration Longest Event: N/A  Other Arrythmias   Type:   Periodic Limb Movement Data: (Primary legs unless otherwise noted) Total # Limb Movement 550 Limb Movement Index 135.0  Total # PLMS 538 PLMS Index 132.0  Total # PLMS Arousals 93 PLMS Arousal Index 22.8  Percentage Sleep Time with PLMS 167. (68.4 % sleep)  Mean Duration limb movements (secs) 1004.1    IPAP Level (cmH2O) EPAP Level (cmH2O) Total Duration (min) Sleep Duration (min) Sleep (%) REM (%) CA  #) OA # MA # HYP #) AHI (#/hr) RERAs # RERAs (#/hr) RDI (#/hr)  4 4 44.1 5.5 12.5 0.0 0 0 0 0 0.0 0 0.0 0.0  5 5 61.2 1.0 1.6 0.0 0 0 0 0 0.0 0 0.0 0.0  6 6 26.9 21.9 81.4 0.0 0 0 0 17 46.6 0 0.0 46.6  8 8  79.1 76.6 96.8 0.0 0 0 0 4 3.1 0  0.0 3.1  9 9  15.8 15.8 100.0 0.0 0 0 0 4 15.2 0 0.0 15.2  10 10  67.6 64.1 94.8 15.2 0 0 0 9 8.4 0 0.0 8.4  12 12  16.2 16.2 100.0 100.0 0 0 0 5 18.5 0 0.0 18.5  13 13  20.1 19.6 97.5 97.5 0 0 0 4 12.2 0 0.0 12.2  15 15  24.5 22.0 89.8 28.6 0 0 0 6 16.4 0 0.0 16.4
